# Patient Record
Sex: Male | Born: 1955 | Race: White | Hispanic: No | Smoking: Never smoker
Health system: Southern US, Community
[De-identification: ages and names within clinical notes are randomized; demographics above are authoritative.]

## PROBLEM LIST (undated history)

## (undated) DIAGNOSIS — K219 Gastro-esophageal reflux disease without esophagitis: Secondary | ICD-10-CM

## (undated) DIAGNOSIS — F32A Depression, unspecified: Secondary | ICD-10-CM

## (undated) DIAGNOSIS — G4730 Sleep apnea, unspecified: Secondary | ICD-10-CM

## (undated) DIAGNOSIS — G2581 Restless legs syndrome: Secondary | ICD-10-CM

## (undated) DIAGNOSIS — T7840XA Allergy, unspecified, initial encounter: Secondary | ICD-10-CM

## (undated) DIAGNOSIS — C801 Malignant (primary) neoplasm, unspecified: Secondary | ICD-10-CM

## (undated) DIAGNOSIS — F419 Anxiety disorder, unspecified: Secondary | ICD-10-CM

## (undated) DIAGNOSIS — K2270 Barrett's esophagus without dysplasia: Secondary | ICD-10-CM

## (undated) DIAGNOSIS — D649 Anemia, unspecified: Secondary | ICD-10-CM

## (undated) DIAGNOSIS — M1990 Unspecified osteoarthritis, unspecified site: Secondary | ICD-10-CM

## (undated) HISTORY — DX: Anxiety disorder, unspecified: F41.9

## (undated) HISTORY — PX: MOUTH SURGERY: SHX715

## (undated) HISTORY — DX: Gastro-esophageal reflux disease without esophagitis: K21.9

## (undated) HISTORY — DX: Malignant (primary) neoplasm, unspecified: C80.1

## (undated) HISTORY — PX: ROTATOR CUFF REPAIR: SHX139

## (undated) HISTORY — DX: Depression, unspecified: F32.A

## (undated) HISTORY — DX: Barrett's esophagus without dysplasia: K22.70

## (undated) HISTORY — DX: Sleep apnea, unspecified: G47.30

## (undated) HISTORY — PX: CHOLECYSTECTOMY: SHX55

## (undated) HISTORY — DX: Anemia, unspecified: D64.9

## (undated) HISTORY — DX: Unspecified osteoarthritis, unspecified site: M19.90

## (undated) HISTORY — DX: Restless legs syndrome: G25.81

## (undated) HISTORY — PX: SKIN CANCER EXCISION: SHX779

## (undated) HISTORY — DX: Allergy, unspecified, initial encounter: T78.40XA

---

## 2005-06-15 ENCOUNTER — Ambulatory Visit: Payer: Self-pay | Admitting: Internal Medicine

## 2005-07-13 ENCOUNTER — Ambulatory Visit: Payer: Self-pay | Admitting: Internal Medicine

## 2005-10-30 ENCOUNTER — Ambulatory Visit: Payer: Self-pay | Admitting: Internal Medicine

## 2005-11-05 ENCOUNTER — Ambulatory Visit: Payer: Self-pay | Admitting: Internal Medicine

## 2005-11-27 ENCOUNTER — Ambulatory Visit: Payer: Self-pay | Admitting: Gastroenterology

## 2005-12-28 ENCOUNTER — Encounter (INDEPENDENT_AMBULATORY_CARE_PROVIDER_SITE_OTHER): Payer: Self-pay | Admitting: *Deleted

## 2005-12-28 ENCOUNTER — Ambulatory Visit: Payer: Self-pay | Admitting: Gastroenterology

## 2006-01-09 ENCOUNTER — Ambulatory Visit: Payer: Self-pay | Admitting: Internal Medicine

## 2006-02-01 ENCOUNTER — Ambulatory Visit: Payer: Self-pay | Admitting: Gastroenterology

## 2006-05-20 ENCOUNTER — Ambulatory Visit: Payer: Self-pay | Admitting: Internal Medicine

## 2006-06-03 ENCOUNTER — Ambulatory Visit (HOSPITAL_COMMUNITY): Admission: RE | Admit: 2006-06-03 | Discharge: 2006-06-03 | Payer: Self-pay | Admitting: General Surgery

## 2006-06-03 ENCOUNTER — Encounter (INDEPENDENT_AMBULATORY_CARE_PROVIDER_SITE_OTHER): Payer: Self-pay | Admitting: Specialist

## 2007-01-13 ENCOUNTER — Ambulatory Visit: Payer: Self-pay | Admitting: Internal Medicine

## 2007-01-13 LAB — CONVERTED CEMR LAB
ALT: 15 units/L (ref 0–40)
AST: 15 units/L (ref 0–37)
Albumin: 4 g/dL (ref 3.5–5.2)
Alkaline Phosphatase: 66 units/L (ref 39–117)
BUN: 8 mg/dL (ref 6–23)
Basophils Absolute: 0 10*3/uL (ref 0.0–0.1)
Basophils Relative: 0.7 % (ref 0.0–1.0)
Bilirubin Urine: NEGATIVE
Bilirubin, Direct: 0.1 mg/dL (ref 0.0–0.3)
CO2: 31 meq/L (ref 19–32)
Calcium: 9 mg/dL (ref 8.4–10.5)
Chloride: 111 meq/L (ref 96–112)
Cholesterol: 179 mg/dL (ref 0–200)
Creatinine, Ser: 1 mg/dL (ref 0.4–1.5)
Eosinophils Absolute: 0.2 10*3/uL (ref 0.0–0.6)
Eosinophils Relative: 3.4 % (ref 0.0–5.0)
GFR calc Af Amer: 102 mL/min
GFR calc non Af Amer: 84 mL/min
Glucose, Bld: 101 mg/dL — ABNORMAL HIGH (ref 70–99)
HCT: 46.4 % (ref 39.0–52.0)
HDL: 49.6 mg/dL (ref >39.0)
Hemoglobin, Urine: NEGATIVE
Hemoglobin: 16.3 g/dL (ref 13.0–17.0)
Ketones, ur: NEGATIVE mg/dL
LDL Cholesterol: 108 mg/dL — ABNORMAL HIGH (ref 0–99)
Leukocytes, UA: NEGATIVE
Lymphocytes Relative: 25.6 % (ref 12.0–46.0)
MCHC: 35.2 g/dL (ref 30.0–36.0)
MCV: 90 fL (ref 78.0–100.0)
Monocytes Absolute: 0.5 10*3/uL (ref 0.2–0.7)
Monocytes Relative: 9.6 % (ref 3.0–11.0)
Neutro Abs: 2.9 10*3/uL (ref 1.4–7.7)
Neutrophils Relative %: 60.7 % (ref 43.0–77.0)
Nitrite: NEGATIVE
Platelets: 214 10*3/uL (ref 150–400)
Potassium: 4.4 meq/L (ref 3.5–5.1)
RBC: 5.16 M/uL (ref 4.22–5.81)
RDW: 12.1 % (ref 11.5–14.6)
Sodium: 145 meq/L (ref 135–145)
Specific Gravity, Urine: 1.02 (ref 1.000–1.03)
TSH: 1.26 microintl units/mL (ref 0.35–5.50)
Total Bilirubin: 0.8 mg/dL (ref 0.3–1.2)
Total CHOL/HDL Ratio: 3.6
Total Protein, Urine: NEGATIVE mg/dL
Total Protein: 6.8 g/dL (ref 6.0–8.3)
Triglycerides: 106 mg/dL (ref 0–149)
Urine Glucose: NEGATIVE mg/dL
Urobilinogen, UA: 0.2 (ref 0.0–1.0)
VLDL: 21 mg/dL (ref 0–40)
WBC: 4.8 10*3/uL (ref 4.5–10.5)
pH: 6 (ref 5.0–8.0)

## 2007-01-21 ENCOUNTER — Ambulatory Visit: Payer: Self-pay | Admitting: Internal Medicine

## 2007-03-17 ENCOUNTER — Telehealth: Payer: Self-pay | Admitting: Internal Medicine

## 2007-03-18 ENCOUNTER — Telehealth (INDEPENDENT_AMBULATORY_CARE_PROVIDER_SITE_OTHER): Payer: Self-pay | Admitting: *Deleted

## 2007-03-28 ENCOUNTER — Telehealth: Payer: Self-pay | Admitting: Internal Medicine

## 2007-04-02 DIAGNOSIS — K219 Gastro-esophageal reflux disease without esophagitis: Secondary | ICD-10-CM | POA: Insufficient documentation

## 2007-05-05 ENCOUNTER — Telehealth: Payer: Self-pay | Admitting: Internal Medicine

## 2007-05-06 ENCOUNTER — Telehealth (INDEPENDENT_AMBULATORY_CARE_PROVIDER_SITE_OTHER): Payer: Self-pay | Admitting: *Deleted

## 2007-07-11 ENCOUNTER — Encounter: Payer: Self-pay | Admitting: Internal Medicine

## 2008-06-01 IMAGING — RF DG CHOLANGIOGRAM OPERATIVE
1 series · 4 of 4 positions shown · non-contrast
Comparison: none

CLINICAL DATA: Cholelithiasis

[Series 1: run · 4 of 26 frames shown]
[frame 1/26]
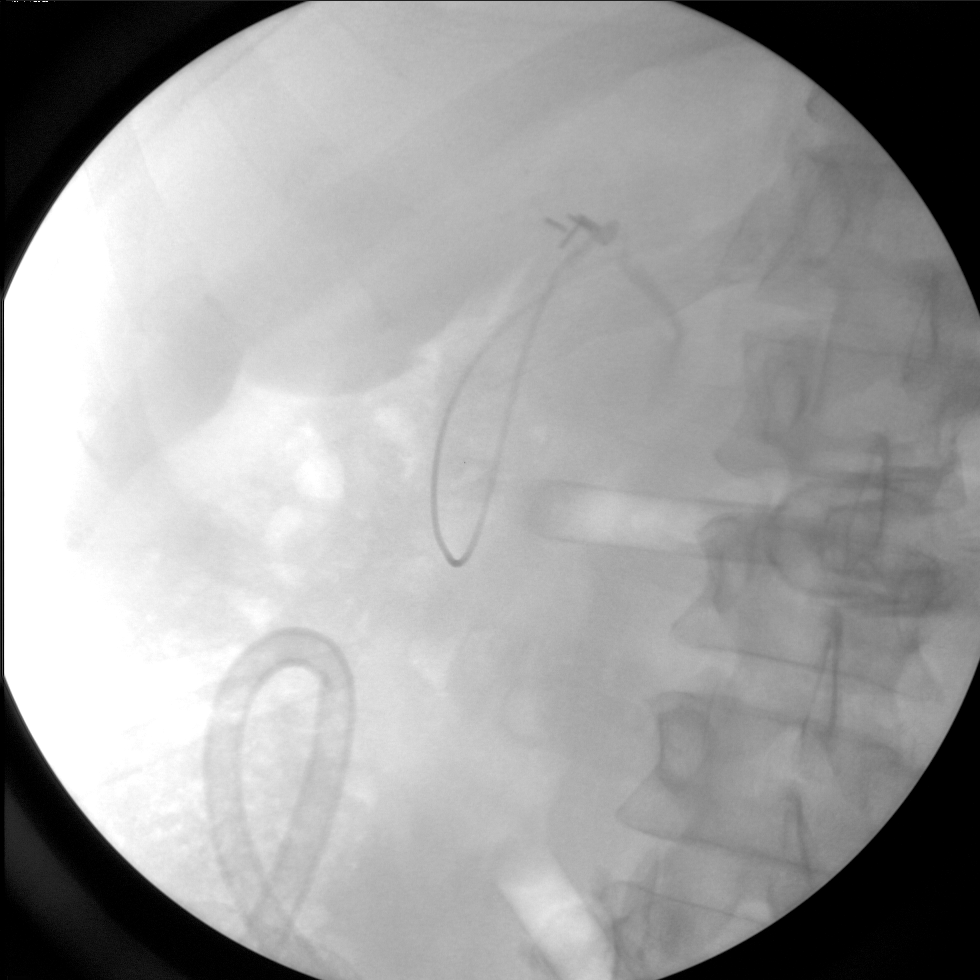
[frame 4/26]
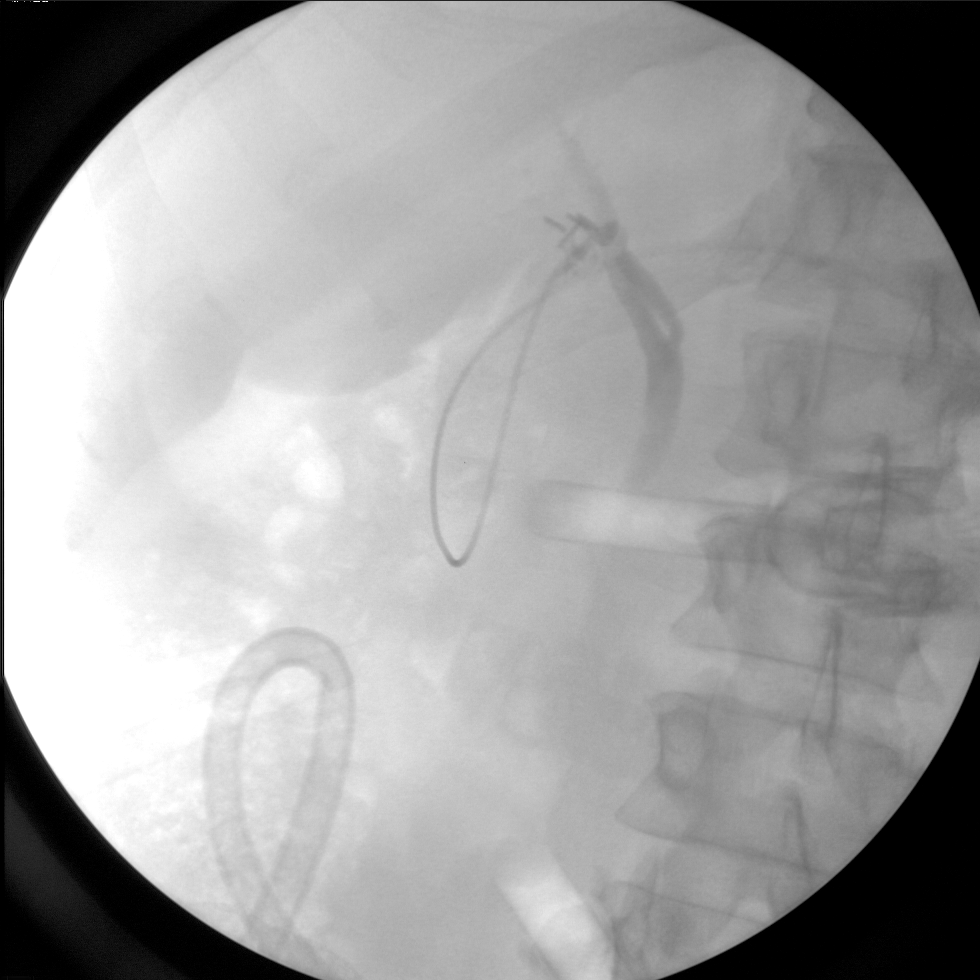
[frame 14/26]
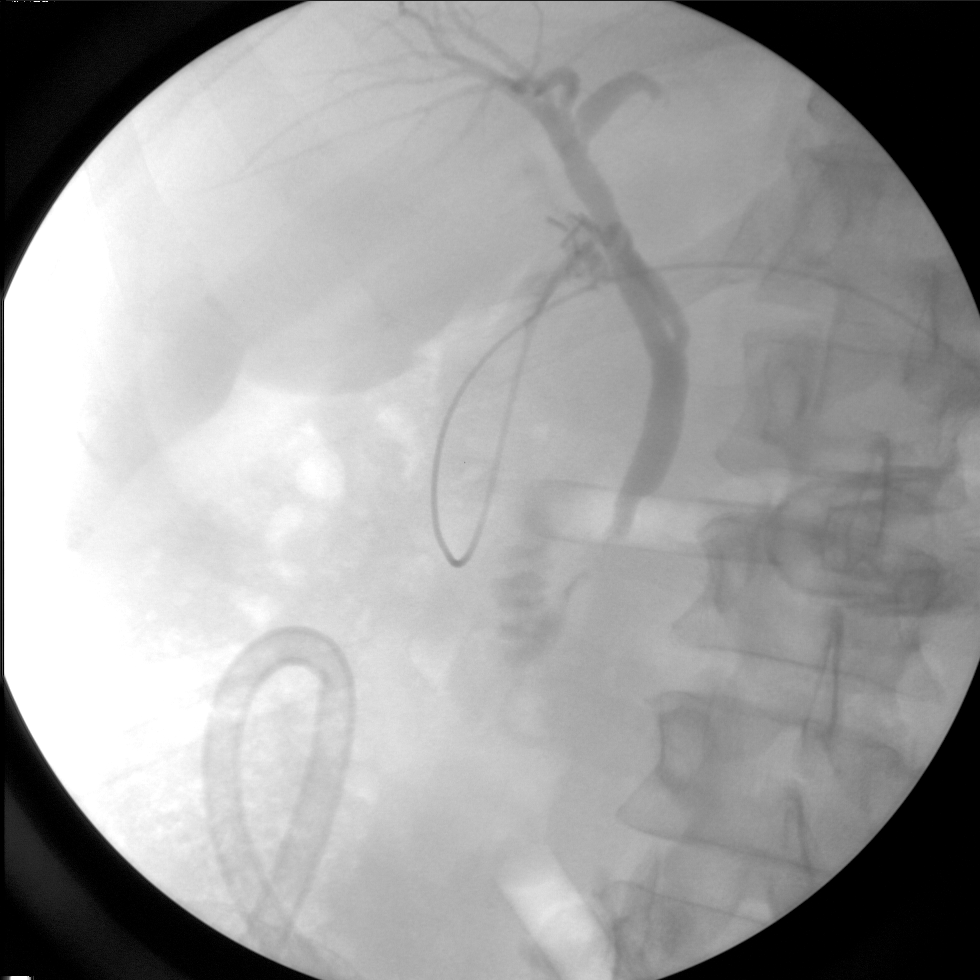
[frame 23/26]
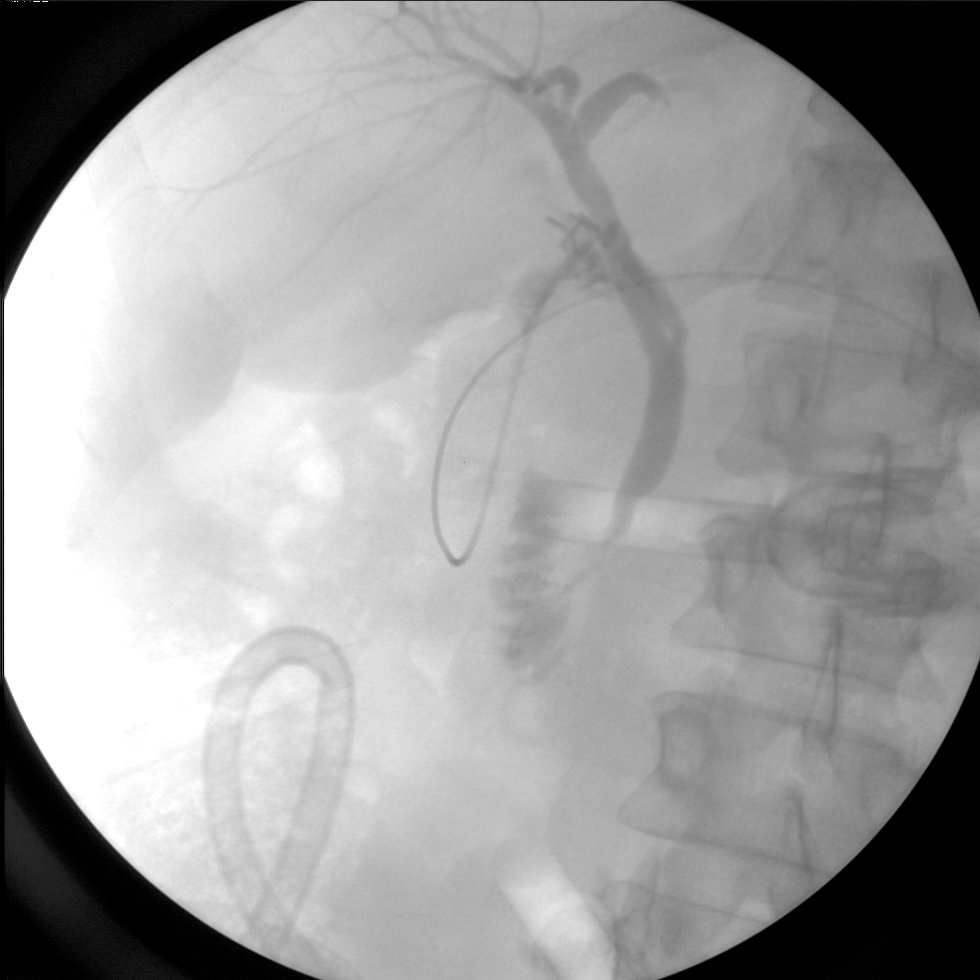

[4 of 4 positions shown; findings below may reference images not displayed]

INTRAOPERATIVE CHOLANGIOGRAM:

26  images from intraoperative C-arm fluoroscopy demonstrate  opacification of
the common bile duct. No filling defects to suggest retained stones. There is
incomplete evaluation of intrahepatic biliary tree, which appears decompressed
centrally. Contrast appears to flow on into decompressed duodenum.
IMPRESSION: 1. Negative for retained common duct stone

## 2009-02-21 ENCOUNTER — Ambulatory Visit: Payer: Self-pay | Admitting: Internal Medicine

## 2010-12-15 NOTE — Op Note (Signed)
NAME:  AASHIR, UMHOLTZ NO.:  [id number]   MEDICAL RECORD NO.:  [id number]          PATIENT TYPE:  AMB   LOCATION:  SDS                          FACILITY:  [facility]   PHYSICIAN:  Cherylynn Ridges, M.D.    DATE OF BIRTH:  Feb 24, 1956   DATE OF PROCEDURE:  06/03/2006  DATE OF DISCHARGE:                                 OPERATIVE REPORT   PREOPERATIVE DIAGNOSIS:  Symptomatic cholelithiasis.   POSTOPERATIVE DIAGNOSIS:  Symptomatic cholelithiasis.   PROCEDURE:  Laparoscopic cholecystectomy with cholangiogram.   SURGEON:  Cherylynn Ridges, M.D.   ASSISTANT:  Adolph Pollack, M.D.   ANESTHESIA:  General endotracheal.   ESTIMATED BLOOD LOSS:  Less than 50 mL.   COMPLICATIONS:  None.   CONDITION:  Stable.   FINDINGS:  Mildly acutely inflamed gallbladder with distension and normal  cholangiogram with good flow into the duodenum, no ductal obstruction.  No  ductal defects and good proximal filling.   INDICATIONS FOR OPERATION:  The patient is a 55 year old recently diagnosed  with gallstones who comes in now for laparoscopic cholecystectomy.   OPERATION:  The patient was taken to the operating room, placed on table in  supine position.  After an adequate endotracheal anesthetic was administered  he was prepped and draped in usual sterile manner exposing midline and right  upper quadrant.   A supraumbilical curvilinear incision was made using #11 blade taken down to  the midline fascia.  We subsequently used Kocher clamps to tent up the  fascia and then make an incision between the Kocher clamps with a 15 blade.  This exposed [place] to the preperitoneal space which upon tenting up the abdomen  with Kocher clamps we were able to dissect down bluntly into the peritoneal  cavity using a Kelly clamp.  Once this was done a pursestring suture of 0  Vicryl was passed around the fascial opening and the Hassan cannula passed  into the peritoneal cavity and secured in place with a  pursestring suture.   Carbon dioxide insufflation was instilled into the peritoneal cavity through  the [facility] cannula up to maximal intra-abdominal pressure of 15 mmHg.  Two  right costal margin 5-mm cannulas and a subxiphoid 11/12 mm cannula passed  under direct vision into the peritoneal cavity.  Once this was done, the  patient was placed in steep reverse Trendelenburg, left side was tilted down  and the dissection begun.   There were some adhesions to the body and dome of the gallbladder that we  were able to take down bluntly and retract the gallbladder towards the  anterior abdominal wall and right upper quadrant.  We then passed a second  grasper onto the infundibulum retracted it laterally as we exposed  peritoneum overlying the triangle of Calot and hepatoduodenal triangle.   Once this peritoneum was exposed, we dissected out the cystic duct and  cystic artery.  Once the cystic duct was adequately dissected away as there  was a blood vessel running along with the cystic duct, we were able to make  a cholecystodochotomy through which a Cook catheter  which had been passed  through the anterior abdominal wall was passed in order performed  cholangiogram.  The cholangiogram showed good flow into the duodenum, no  ductal obstruction, no filling defects and good proximal filling.   Once cholangiogram was completed, we insufflated the abdomen again, removed  the clips securing the catheter in place and then clipped the cystic duct  distally x3.   We transected the cystic duct which on cholangiogram showed to be long and  extended across the common duct into the medial aspect of the common duct.  We then subsequently dissected out the cystic artery which was clipped  proximally and distally and transected.  We then dissected out the  gallbladder from its bed with minimal difficulty obtaining hemostasis in the  gallbladder bed using electrocautery.   Once cholangiogram was  completed and the gallbladder completely removed from  its bed, we cauterized for hemostasis found to be good hemostasis, we  removed the gallbladder from the supraumbilical site with minimal  difficulty.  We closed out the supraumbilical fascial site using the  pursestring suture which was in place from the [place] cannula and we  irrigated all sites and the intra-abdominal with saline solution.  All  counts were correct.  We injected 0.25% Marcaine with epi at all sites and  we closed the supraumbilical skin and the subxiphoid skin using running  subcuticular stitch of 4-0 Vicryl.  Dermabond was used to close the 5-mm  cannula site.  Steri-Strips were placed on all wounds, antibiotic ointment  and then a sterile dressing.  All needle, sponge counts and instrument  counts were correct.      Cherylynn Ridges, M.D.  Electronically Signed     JOW/MEDQ  D:  06/03/2006  T:  06/04/2006  Job:  [id number]

## 2017-10-02 LAB — HM COLONOSCOPY

## 2021-11-27 ENCOUNTER — Encounter: Payer: Self-pay | Admitting: Nurse Practitioner

## 2021-11-27 ENCOUNTER — Ambulatory Visit: Payer: Medicare Other | Attending: Nurse Practitioner | Admitting: Nurse Practitioner

## 2021-11-27 VITALS — BP 120/80 | Ht 69.0 in | Wt 193.3 lb

## 2021-11-27 DIAGNOSIS — G8929 Other chronic pain: Secondary | ICD-10-CM | POA: Diagnosis not present

## 2021-11-27 DIAGNOSIS — Z7689 Persons encountering health services in other specified circumstances: Secondary | ICD-10-CM

## 2021-11-27 DIAGNOSIS — M5441 Lumbago with sciatica, right side: Secondary | ICD-10-CM

## 2021-11-27 DIAGNOSIS — M5442 Lumbago with sciatica, left side: Secondary | ICD-10-CM

## 2021-11-27 DIAGNOSIS — K2270 Barrett's esophagus without dysplasia: Secondary | ICD-10-CM
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
## 2021-11-28 ENCOUNTER — Encounter: Payer: Self-pay | Admitting: Nurse Practitioner

## 2021-12-04 ENCOUNTER — Ambulatory Visit: Payer: Medicare Other | Admitting: Physician Assistant

## 2021-12-06 ENCOUNTER — Encounter: Payer: Self-pay | Admitting: Orthopaedic Surgery

## 2021-12-06 ENCOUNTER — Ambulatory Visit (INDEPENDENT_AMBULATORY_CARE_PROVIDER_SITE_OTHER): Payer: Medicare Other

## 2021-12-06 ENCOUNTER — Ambulatory Visit: Payer: Medicare Other | Admitting: Orthopaedic Surgery

## 2021-12-06 DIAGNOSIS — M545 Low back pain, unspecified: Secondary | ICD-10-CM

## 2021-12-06 DIAGNOSIS — G8929 Other chronic pain: Secondary | ICD-10-CM | POA: Diagnosis not present

## 2021-12-06 DIAGNOSIS — M5126 Other intervertebral disc displacement, lumbar region: Secondary | ICD-10-CM
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
## 2021-12-12 ENCOUNTER — Encounter: Payer: Self-pay | Admitting: Nurse Practitioner

## 2021-12-15 ENCOUNTER — Other Ambulatory Visit: Payer: Self-pay | Admitting: Nurse Practitioner

## 2021-12-15 DIAGNOSIS — G4733 Obstructive sleep apnea (adult) (pediatric): Secondary | ICD-10-CM

## 2021-12-15 MED ORDER — TRAMADOL HCL 50 MG PO TABS: 50.0000 mg | ORAL_TABLET | Freq: Three times a day (TID) | ORAL | 2 refills | Status: AC

## 2021-12-15 MED ORDER — MISC. DEVICES MISC: 0 refills | Status: AC

## 2021-12-16 ENCOUNTER — Encounter: Payer: Self-pay | Admitting: Nurse Practitioner

## 2021-12-20 ENCOUNTER — Telehealth: Payer: Self-pay | Admitting: Nurse Practitioner

## 2021-12-20 NOTE — Telephone Encounter (Signed)
Paperwork has been received and will be completed by Pharmacy.

## 2021-12-20 NOTE — Telephone Encounter (Signed)
Copied from [place] [id number]. Topic: General - Other >> Dec 20, 2021  9:01 AM Yvette Rack wrote: Reason for CRM: Hassan Rowan with Medicare called to discuss PA for traMADol (ULTRAM) 50 MG tablet. Cb# [phone] Option# 5

## 2021-12-27 DIAGNOSIS — F329 Major depressive disorder, single episode, unspecified: Secondary | ICD-10-CM | POA: Diagnosis not present

## 2022-01-16 ENCOUNTER — Encounter: Payer: Self-pay | Admitting: Orthopaedic Surgery

## 2022-01-16 ENCOUNTER — Ambulatory Visit: Payer: Medicare Other | Admitting: Orthopaedic Surgery

## 2022-01-16 VITALS — BP 125/82 | HR 65 | Ht 69.0 in | Wt 193.0 lb

## 2022-01-16 DIAGNOSIS — M5126 Other intervertebral disc displacement, lumbar region: Secondary | ICD-10-CM | POA: Diagnosis not present
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
## 2022-01-18 ENCOUNTER — Encounter: Payer: Self-pay | Admitting: Nurse Practitioner

## 2022-02-27 ENCOUNTER — Ambulatory Visit: Payer: Self-pay | Admitting: Internal Medicine

## 2022-03-14 ENCOUNTER — Encounter: Payer: Self-pay | Admitting: Nurse Practitioner

## 2022-03-14 ENCOUNTER — Other Ambulatory Visit: Payer: Self-pay | Admitting: Nurse Practitioner

## 2022-03-23 ENCOUNTER — Ambulatory Visit: Payer: Medicare Other | Admitting: Internal Medicine

## 2022-03-24 ENCOUNTER — Other Ambulatory Visit: Payer: Self-pay | Admitting: Nurse Practitioner

## 2022-03-24 DIAGNOSIS — F339 Major depressive disorder, recurrent, unspecified: Secondary | ICD-10-CM

## 2022-03-24 DIAGNOSIS — M5126 Other intervertebral disc displacement, lumbar region: Secondary | ICD-10-CM

## 2022-03-24 MED ORDER — VENLAFAXINE HCL 37.5 MG PO TABS: 37.5000 mg | ORAL_TABLET | Freq: Two times a day (BID) | ORAL | 1 refills | Status: DC

## 2022-03-24 MED ORDER — TRAMADOL HCL 50 MG PO TABS: 50.0000 mg | ORAL_TABLET | Freq: Three times a day (TID) | ORAL | 2 refills | Status: DC | PRN

## 2022-04-10 ENCOUNTER — Encounter: Payer: Self-pay | Admitting: Nurse Practitioner

## 2022-04-13 ENCOUNTER — Other Ambulatory Visit: Payer: Self-pay | Admitting: Nurse Practitioner

## 2022-04-13 DIAGNOSIS — K219 Gastro-esophageal reflux disease without esophagitis: Secondary | ICD-10-CM

## 2022-04-13 MED ORDER — OMEPRAZOLE 20 MG PO CPDR: 20.0000 mg | DELAYED_RELEASE_CAPSULE | Freq: Two times a day (BID) | ORAL | 1 refills | Status: DC

## 2022-05-10 ENCOUNTER — Ambulatory Visit: Payer: Medicare Other | Admitting: Physician Assistant

## 2022-06-04 ENCOUNTER — Ambulatory Visit: Payer: Medicare Other | Admitting: Nurse Practitioner

## 2022-06-11 ENCOUNTER — Encounter: Payer: Self-pay | Admitting: Gastroenterology

## 2022-06-25 ENCOUNTER — Other Ambulatory Visit: Payer: Self-pay | Admitting: Nurse Practitioner

## 2022-06-25 DIAGNOSIS — F339 Major depressive disorder, recurrent, unspecified: Secondary | ICD-10-CM

## 2022-06-25 DIAGNOSIS — M5126 Other intervertebral disc displacement, lumbar region: Secondary | ICD-10-CM

## 2022-06-26 ENCOUNTER — Encounter: Payer: Self-pay | Admitting: Nurse Practitioner

## 2022-06-26 ENCOUNTER — Other Ambulatory Visit: Payer: Self-pay | Admitting: Nurse Practitioner

## 2022-06-26 DIAGNOSIS — M5126 Other intervertebral disc displacement, lumbar region: Secondary | ICD-10-CM

## 2022-06-26 MED ORDER — TRAMADOL HCL 50 MG PO TABS: 50.0000 mg | ORAL_TABLET | Freq: Three times a day (TID) | ORAL | 2 refills | Status: DC | PRN

## 2022-06-26 NOTE — Telephone Encounter (Signed)
Unable to refill per protocol, Rx request is too soon. Last RF 03/24/22 for 90 and 1 RF. Will refuse.  Requested Prescriptions  Pending Prescriptions Disp Refills   venlafaxine (EFFEXOR) 37.5 MG tablet [Pharmacy Med Name: VENLAFAXINE HCL 37.5 MG TABLET] 180 tablet 1    Sig: TAKE 1 TABLET BY MOUTH 2 TIMES DAILY.     Psychiatry: Antidepressants - SNRI - desvenlafaxine & venlafaxine Failed - 06/25/2022  6:08 PM      Failed - Cr in normal range and within 360 days    Creatinine, Ser  Date Value Ref Range Status  01/13/2007 1.0 0.4 - 1.5 mg/dL Final         Failed - Valid encounter within last 6 months    Recent Outpatient Visits           7 months ago Encounter to establish care   [name], [place] W, NP              Failed - Lipid Panel in normal range within the last 12 months    Cholesterol  Date Value Ref Range Status  01/13/2007 179 0 - 200 mg/dL Final    Comment:    See lab report for associated comment(s)   LDL Cholesterol  Date Value Ref Range Status  01/13/2007 108 (H) 0 - 99 mg/dL Final   HDL  Date Value Ref Range Status  01/13/2007 49.6 >39.0 mg/dL Final   Triglycerides  Date Value Ref Range Status  01/13/2007 106 0 - 149 mg/dL Final    Comment:    See lab report for associated comment(s)         Passed - Last BP in normal range    BP Readings from Last 1 Encounters:  01/16/22 125/82          traMADol (ULTRAM) 50 MG tablet [Pharmacy Med Name: TRAMADOL HCL 50 MG TABLET] 90 tablet 2    Sig: TAKE 1 TABLET BY MOUTH EVERY 8 HOURS AS NEEDED.     Not Delegated - Analgesics:  Opioid Agonists Failed - 06/25/2022  6:08 PM      Failed - This refill cannot be delegated      Failed - Urine Drug Screen completed in last 360 days      Failed - Valid encounter within last 3 months    Recent Outpatient Visits           7 months ago Encounter to establish care   [facility], [name],  NP

## 2022-07-04 ENCOUNTER — Telehealth: Payer: Self-pay | Admitting: *Deleted

## 2022-07-04 NOTE — Telephone Encounter (Signed)
Pt. Scheduled as a direct EGD,please review chart and advise if ok to proceed as direct or if need OV ?

## 2022-07-04 NOTE — Telephone Encounter (Signed)
Call pt.to schedule OV Message left with call back #.

## 2022-07-09 NOTE — Telephone Encounter (Signed)
Message left with call back #.

## 2022-07-11 ENCOUNTER — Telehealth: Payer: Self-pay

## 2022-07-11 NOTE — Telephone Encounter (Signed)
PV on 12/14 in [place] to be moved to earlier appt. If patient is available

## 2022-07-12 ENCOUNTER — Ambulatory Visit (AMBULATORY_SURGERY_CENTER): Payer: Self-pay

## 2022-07-12 ENCOUNTER — Other Ambulatory Visit: Payer: Self-pay

## 2022-07-12 VITALS — Ht 69.0 in | Wt 200.0 lb

## 2022-07-12 DIAGNOSIS — K2270 Barrett's esophagus without dysplasia: Secondary | ICD-10-CM

## 2022-07-12 NOTE — Progress Notes (Signed)
Denies allergies to eggs or soy products. Denies complication of anesthesia or sedation. Denies use of weight loss medication. Denies use of O2.   Emmi instructions given for colonoscopy.  

## 2022-07-13 NOTE — Telephone Encounter (Signed)
Attempted to contact pt,message left with call back #.

## 2022-07-16 NOTE — Telephone Encounter (Signed)
Attempted to contact pt. At alternative # [phone] message left with call back #.

## 2022-07-16 NOTE — Telephone Encounter (Signed)
Attempted to call pt. Message left that we need to schedule OV and call back # left.

## 2022-07-25 ENCOUNTER — Encounter: Payer: Self-pay | Admitting: Gastroenterology

## 2022-07-25 NOTE — Telephone Encounter (Signed)
Called pt and spoke with him about needing his prior GI hx and Dr. Candis Schatz would like an OV prior to [place]. Pt states he has all his prior EGD reports and was told at the previsit that he did'nt need to bring those in. He is willing to bring records in for Dr. Candis Schatz and proceed with EGD as scheduled or come for an OV first, whatever Dr. Candis Schatz prefers. Please advise.

## 2022-07-26 NOTE — Telephone Encounter (Signed)
Spoke with pt and he is aware and will bring his records in to be scanned into the EMR. EGD cancelled. OV scheduled for pt 09/28/22 '@3'$ :40pm as pt will be traveling for work in Jan and Feb. Pt aware of appt.

## 2022-08-01 ENCOUNTER — Encounter: Payer: Medicare Other | Admitting: Gastroenterology

## 2022-08-08 DIAGNOSIS — F329 Major depressive disorder, single episode, unspecified: Secondary | ICD-10-CM | POA: Diagnosis not present

## 2022-08-13 ENCOUNTER — Other Ambulatory Visit: Payer: Self-pay | Admitting: Nurse Practitioner

## 2022-08-13 ENCOUNTER — Encounter: Payer: Self-pay | Admitting: Nurse Practitioner

## 2022-08-13 MED ORDER — SILDENAFIL CITRATE 50 MG PO TABS: 50.0000 mg | ORAL_TABLET | Freq: Every day | ORAL | 1 refills | Status: DC | PRN

## 2022-08-28 ENCOUNTER — Ambulatory Visit: Payer: Medicare Other | Admitting: Gastroenterology

## 2022-09-27 ENCOUNTER — Encounter: Payer: Self-pay | Admitting: Nurse Practitioner

## 2022-09-28 ENCOUNTER — Other Ambulatory Visit: Payer: Self-pay | Admitting: Nurse Practitioner

## 2022-09-28 ENCOUNTER — Ambulatory Visit (INDEPENDENT_AMBULATORY_CARE_PROVIDER_SITE_OTHER): Payer: Medicare Other | Admitting: Gastroenterology

## 2022-09-28 ENCOUNTER — Encounter: Payer: Self-pay | Admitting: Gastroenterology

## 2022-09-28 VITALS — BP 120/80 | HR 70 | Ht 69.0 in | Wt 197.0 lb

## 2022-09-28 DIAGNOSIS — F339 Major depressive disorder, recurrent, unspecified: Secondary | ICD-10-CM

## 2022-09-28 DIAGNOSIS — K2270 Barrett's esophagus without dysplasia: Secondary | ICD-10-CM

## 2022-09-28 DIAGNOSIS — K219 Gastro-esophageal reflux disease without esophagitis: Secondary | ICD-10-CM

## 2022-09-28 NOTE — Patient Instructions (Addendum)
_______________________________________________________  If your blood pressure at your visit was 140/90 or greater, please contact your primary care physician to follow up on this.  _______________________________________________________  If you are age 66 or older, your body mass index should be between 23-30. Your Body mass index is 29.09 kg/m. If this is out of the aforementioned range listed, please consider follow up with your Primary Care Provider.  If you are age 84 or younger, your body mass index should be between 19-25. Your Body mass index is 29.09 kg/m. If this is out of the aformentioned range listed, please consider follow up with your Primary Care Provider.   Please send a mychart message after  you follow up with primary care.  Decrease Omeprazole to once daily. ________________________________________________________  The [facility] providers would like to encourage you to use [facility] to communicate with providers for non-urgent requests or questions.  Due to long hold times on the telephone, sending your provider a message by [facility] may be a faster and more efficient way to get a response.  Please allow 48 business hours for a response.  Please remember that this is for non-urgent requests.   It was a pleasure to see you today!  Thank you for trusting me with your gastrointestinal care!    Scott E.Candis Schatz, MD

## 2022-09-28 NOTE — Progress Notes (Addendum)
HPI : Kurt Martinez is a very pleasant 67 year old male with a history of Barrett's esophagus, anxiety, depression and OSA who is referred to by Geryl Rankins, NP to establish care for ongoing Barrett's surveillance.  The patient states that he was diagnosed with Barrett's in 2007 and has undergone close surveillance ever since then.  He was previously followed by a provider in [place]. He states that he has never had dysplasia and has never undergone ablation or any other treatment's for Barrett's. He has been taking omeprazole twice daily since 2007.  His GERD symptoms have been very well controlled on this regimen.  He has never tried decreasing to once a day.  No dysphagia.  Weight is stable. He has no family history of esophageal cancer (father died from unknown cancer at age 35) He quit smoking many years ago  His last EGD was in 2019 and 4 cm of circumferential [id number]) Barrett's mucosa was seen.  Biopsies showed intestinal metaplasia without dysplasia.  The patient is very active and exercises regularly.  He has no known cardiopulmonary comorbidities or any chronic concerning symptoms.  However, last week, he experienced very profound symptoms of fatigue, light-headedness/dizziness with shortness of breath.  This lasted an entire day.  He denied chest pain/pressure. When he woke up the next day, he felt fine.  He did not take his pulse or his blood pressure during this time.  He has never had any symptoms like that before or since.   He has a reported history of propofol allergy.  This stemmed from his EGD around 2016 (the one before 2019). He apparently awoke from his procedure and had a diffuse red rash with swelling.  No respiratory symptoms, no itching.  He did not require any benadryl or steroids and the rash resolved, but it was felt to be most likely secondary to the propofol.  His EGD in 2019 was performed using versed and demerol.  EGD 2019:  Long-segment (4cm) nondysplastic  Barrett's Colonoscopy 2019:  Normal  Past Medical History:  Diagnosis Date   Allergy    Anemia    Anxiety    Arthritis    Barretts esophagus    Cancer ([place])    Skin cancer   Depression    GERD (gastroesophageal reflux disease)    Restless leg syndrome    Sleep apnea      Past Surgical History:  Procedure Laterality Date   CHOLECYSTECTOMY     2007   MOUTH SURGERY     tooth removed   ROTATOR CUFF REPAIR Right    SKIN CANCER EXCISION     Basel cell 2008, 2003   Family History  Problem Relation Age of Onset   Brain cancer Mother    Cancer Father    Prostate cancer Brother    Colon cancer Neg Hx    Esophageal cancer Neg Hx    Rectal cancer Neg Hx    Stomach cancer Neg Hx    Social History   Tobacco Use   Smoking status: Never   Smokeless tobacco: Never  Vaping Use   Vaping Use: Never used  Substance Use Topics   Alcohol use: Yes    Comment: once a month   Drug use: Never   Current Outpatient Medications  Medication Sig Dispense Refill   Misc. Devices MISC Please provide patient with insurance approved cpap equipment (mask and hose). Faxed to [facility] in [place], [place]. ICD 10 G47.33 Z99.89 1 each 0  omeprazole (PRILOSEC) 20 MG capsule Take 1 capsule (20 mg total) by mouth 2 (two) times daily before a meal. 180 capsule 1   sildenafil (VIAGRA) 50 MG tablet Take 1-2 tablets (50-100 mg total) by mouth daily as needed for erectile dysfunction. 10 tablet 1   traMADol (ULTRAM) 50 MG tablet Take 1 tablet (50 mg total) by mouth every 8 (eight) hours as needed. 90 tablet 2   venlafaxine (EFFEXOR) 37.5 MG tablet Take 1 tablet (37.5 mg total) by mouth 2 (two) times daily. 180 tablet 1   No current facility-administered medications for this visit.   Allergies  Allergen Reactions   Propofol Other (See Comments), Rash and Shortness Of Breath    Patient states he had an endoscopy procedure recently, was told by MD they had to oxygenate him  for a while, had difficulty waking him and he developed rash\whelps      Review of Systems: All systems reviewed and negative except where noted in HPI.    No results found.  Physical Exam: BP 120/80   Pulse 70   Ht '5\' 9"'$  (1.753 m)   Wt 197 lb (89.4 kg)   SpO2 98%   BMI 29.09 kg/m  Constitutional: Pleasant,well-developed, Caucasian male in no acute distress. HEENT: Normocephalic and atraumatic. Conjunctivae are normal. No scleral icterus. Cardiovascular: Normal rate, regular rhythm.  Pulmonary/chest: Effort normal and breath sounds normal. No wheezing, rales or rhonchi. Abdominal: Soft, nondistended, nontender. Bowel sounds active throughout. There are no masses palpable. No hepatomegaly. Extremities: no edema Neurological: Alert and oriented to person place and time. Skin: Skin is warm and dry. No rashes noted. Psychiatric: Normal mood and affect. Behavior is normal.  CBC    Component Value Date/Time   WBC 4.8 01/13/2007 0834   RBC 5.16 01/13/2007 0834   HGB 16.3 01/13/2007 0834   HCT 46.4 01/13/2007 0834   PLT 214 01/13/2007 0834   MCV 90.0 01/13/2007 0834   MCHC 35.2 01/13/2007 0834   RDW 12.1 01/13/2007 0834   MONOABS 0.5 01/13/2007 0834   EOSABS 0.2 01/13/2007 0834   BASOSABS 0.0 01/13/2007 0834    CMP     Component Value Date/Time   NA 145 01/13/2007 0834   K 4.4 01/13/2007 0834   CL 111 01/13/2007 0834   CO2 31 01/13/2007 0834   GLUCOSE 101 (H) 01/13/2007 0834   BUN 8 01/13/2007 0834   CREATININE 1.0 01/13/2007 0834   CALCIUM 9.0 01/13/2007 0834   PROT 6.8 01/13/2007 0834   ALBUMIN 4.0 01/13/2007 0834   AST 15 01/13/2007 0834   ALT 15 01/13/2007 0834   ALKPHOS 66 01/13/2007 0834   BILITOT 0.8 01/13/2007 0834   GFRNONAA 84 01/13/2007 0834   GFRAA 102 01/13/2007 0834     ASSESSMENT AND PLAN: 67 year old male with long-standing nondysplastic Barrett's due for surveillance.  He had a recent episode that was concerning for a possible arrhythmia or  other cardiac event.  I told him that he should see his PCP (he has an appointment next week) to discuss further evaluation.  I think it would be imprudent to schedule him for an elective sedated procedure if he may have an underlying cardiology issue. The patient will message me following his appointment to discuss what further evaluation is recommended. We will plan to do his EGD after all recommended cardiology evaluation is complete.  Will plan to use moderate sedation given suspected propofol allergy. I recommended he try decreasing his omeprazole dose to once daily  to see if this adequately controls his GERD symptoms.  Barrett's esophagus - EGD with moderated sedation pending potential further cardiac evaluation - Decrease omeprazole to once daily  Kurt Martinez E. Candis Schatz, MD [facility]  CC:  Gildardo Pounds, NP   ADDENDUM: EGD Report October 02, 2017 Dr. Renold Don [facility], [place], [place]) Indication:  Barrett's esophagus without dysplasia Findings: Esophagus:  Salmon colored mucosa from 32 cm to 38 cm, four quadrant biopsies taken at 2 cm intervals Stomach:  Medium sized hiatal hernia, multiple sessile polyps consistent with fundic gland polyps, not biopsied/removed Duodenum:  Normal  Path: Esophagus at 32 cm and 34 cm:  Barrett's mucosa without dysplasia  Colonoscopy Report October 02, 2017 Dr. Renold Don [facility], [place], [place]) Indication:  Colon cancer screening (average risk) Findings:  Mild diverticulosis of the right colon, moderate diverticulosis of the left colon 4 mm polyp in sigmoid colon Internal Hemorrhoids  Path: Tubular adenoma

## 2022-10-02 ENCOUNTER — Encounter: Payer: Self-pay | Admitting: Nurse Practitioner

## 2022-10-02 ENCOUNTER — Ambulatory Visit: Payer: Medicare Other | Attending: Nurse Practitioner | Admitting: Nurse Practitioner

## 2022-10-02 ENCOUNTER — Telehealth: Payer: Self-pay | Admitting: Gastroenterology

## 2022-10-02 ENCOUNTER — Other Ambulatory Visit: Payer: Self-pay

## 2022-10-02 VITALS — BP 135/79 | HR 68 | Ht 69.0 in | Wt 199.2 lb

## 2022-10-02 DIAGNOSIS — Z79899 Other long term (current) drug therapy: Secondary | ICD-10-CM | POA: Insufficient documentation

## 2022-10-02 DIAGNOSIS — L989 Disorder of the skin and subcutaneous tissue, unspecified: Secondary | ICD-10-CM | POA: Insufficient documentation

## 2022-10-02 DIAGNOSIS — Z0000 Encounter for general adult medical examination without abnormal findings: Secondary | ICD-10-CM | POA: Diagnosis not present

## 2022-10-02 DIAGNOSIS — F339 Major depressive disorder, recurrent, unspecified: Secondary | ICD-10-CM | POA: Insufficient documentation

## 2022-10-02 DIAGNOSIS — M5126 Other intervertebral disc displacement, lumbar region: Secondary | ICD-10-CM

## 2022-10-02 DIAGNOSIS — E781 Pure hyperglyceridemia: Secondary | ICD-10-CM | POA: Diagnosis not present

## 2022-10-02 DIAGNOSIS — G2581 Restless legs syndrome: Secondary | ICD-10-CM | POA: Diagnosis not present

## 2022-10-02 DIAGNOSIS — Z85828 Personal history of other malignant neoplasm of skin: Secondary | ICD-10-CM | POA: Insufficient documentation

## 2022-10-02 DIAGNOSIS — Z1159 Encounter for screening for other viral diseases: Secondary | ICD-10-CM | POA: Insufficient documentation

## 2022-10-02 DIAGNOSIS — R7989 Other specified abnormal findings of blood chemistry: Secondary | ICD-10-CM | POA: Insufficient documentation

## 2022-10-02 MED ORDER — BACLOFEN 10 MG PO TABS: 10.0000 mg | ORAL_TABLET | Freq: Every day | ORAL | 0 refills | Status: DC | PRN

## 2022-10-02 MED ORDER — TRAMADOL HCL 50 MG PO TABS: 50.0000 mg | ORAL_TABLET | Freq: Three times a day (TID) | ORAL | 2 refills | Status: DC | PRN

## 2022-10-02 MED ORDER — VENLAFAXINE HCL 37.5 MG PO TABS: 37.5000 mg | ORAL_TABLET | Freq: Two times a day (BID) | ORAL | 2 refills | Status: DC

## 2022-10-02 NOTE — Progress Notes (Signed)
Assessment & Plan:  Kurt Martinez was seen today for medication management.  Diagnoses and all orders for this visit:  Encounter for annual physical exam -     CMP14+EGFR -     Lipid panel -     CBC with Differential -     HCV Ab w Reflex to Quant PCR  Need for hepatitis C screening test -     HCV Ab w Reflex to Quant PCR  Abnormal CBC -     CBC with Differential  Hypertriglyceridemia -     Lipid panel  Skin lesion -     Ambulatory referral to Dermatology  Restless leg syndrome -     baclofen (LIORESAL) 10 MG tablet; Take 1-2 tablets (10-20 mg total) by mouth daily as needed. FOR BREAKTHROUGH PAIN  Recurrent major depressive disorder, remission status unspecified (HCC) -     venlafaxine (EFFEXOR) 37.5 MG tablet; Take 1 tablet (37.5 mg total) by mouth 2 (two) times daily.    Patient has been counseled on age-appropriate routine health concerns for screening and prevention. These are reviewed and up-to-date. Referrals have been placed accordingly. Immunizations are up-to-date or declined.    Subjective:   Chief Complaint  Patient presents with   Medication Management   HPI Kurt Martinez 67 y.o. male presents to office today for annual physical.  Has a skin lesion that needs to be evaluated by dermatology.  He reports the current skin lesion is in the same area as a previous basal cell carcinoma.    Would like to know if he can take 100 mg of Viagra instead of 50 mg.  States Viagra is not as effective at 50 mg due to his chronic use of venlafaxine   On February 21 he woke up fatigue, head feeling full and thick with difficulty holding it up.  He had to lie down all day and also at that time he felt pressure in his upper left chest that did not linger for a long duration of time.  Associated symptoms included shortness of breath which persisted for a few days after initial symptoms.  He does have a history of hypertriglyceridemia.  No history of diabetes, hypertension,  coronary artery disease.  EKG performed today was normal. BP Readings from Last 3 Encounters:  10/02/22 135/79  09/28/22 120/80  01/16/22 125/82     No Would like to know if he can take an extra tramadol for breakthrough restless leg pain.  I have declined any additional tramadol today however we will try baclofen to see if this will help.   Review of Systems  Constitutional:  Negative for fever, malaise/fatigue and weight loss.  HENT: Negative.  Negative for nosebleeds.   Eyes: Negative.  Negative for blurred vision, double vision and photophobia.  Respiratory: Negative.  Negative for cough and shortness of breath.   Cardiovascular: Negative.  Negative for chest pain, palpitations and leg swelling.  Gastrointestinal: Negative.  Negative for heartburn, nausea and vomiting.  Genitourinary: Negative.   Musculoskeletal: Negative.  Negative for myalgias.  Skin: Negative.   Neurological:  Positive for sensory change. Negative for dizziness, focal weakness, seizures and headaches.  Endo/Heme/Allergies: Negative.   Psychiatric/Behavioral: Negative.  Negative for suicidal ideas.     Past Medical History:  Diagnosis Date   Allergy    Anemia    Anxiety    Arthritis    Barretts esophagus    Cancer ([place])    Skin cancer   Depression  GERD (gastroesophageal reflux disease)    Restless leg syndrome    Sleep apnea     Past Surgical History:  Procedure Laterality Date   CHOLECYSTECTOMY     2007   MOUTH SURGERY     tooth removed   ROTATOR CUFF REPAIR Right    SKIN CANCER EXCISION     Basel cell 2008, 2003    Family History  Problem Relation Age of Onset   Brain cancer Mother    Cancer Father    Prostate cancer Brother    Colon cancer Neg Hx    Esophageal cancer Neg Hx    Rectal cancer Neg Hx    Stomach cancer Neg Hx     Social History Reviewed with no changes to be made today.   Outpatient Medications Prior to Visit  Medication Sig Dispense Refill   Misc. Devices  MISC Please provide patient with insurance approved cpap equipment (mask and hose). Faxed to [facility] in [place], [place]. ICD 10 G47.33 Z99.89 1 each 0   omeprazole (PRILOSEC) 20 MG capsule Take 1 capsule (20 mg total) by mouth 2 (two) times daily before a meal. 180 capsule 1   sildenafil (VIAGRA) 50 MG tablet Take 1-2 tablets (50-100 mg total) by mouth daily as needed for erectile dysfunction. 10 tablet 1   traMADol (ULTRAM) 50 MG tablet Take 1 tablet (50 mg total) by mouth every 8 (eight) hours as needed. 90 tablet 2   venlafaxine (EFFEXOR) 37.5 MG tablet TAKE 1 TABLET BY MOUTH 2 TIMES DAILY. 60 tablet 0   No facility-administered medications prior to visit.    Allergies  Allergen Reactions   Propofol Other (See Comments), Rash and Shortness Of Breath    Patient states he had an endoscopy procedure recently, was told by MD they had to oxygenate him for a while, had difficulty waking him and he developed rash\whelps        Objective:    BP 135/79   Pulse 68   Ht '5\' 9"'$  (1.753 m)   Wt 199 lb 3.2 oz (90.4 kg)   SpO2 96%   BMI 29.42 kg/m  Wt Readings from Last 3 Encounters:  10/02/22 199 lb 3.2 oz (90.4 kg)  09/28/22 197 lb (89.4 kg)  07/12/22 200 lb (90.7 kg)    Physical Exam Constitutional:      Appearance: He is well-developed.  HENT:     Head: Normocephalic and atraumatic.     Right Ear: Hearing, tympanic membrane, ear canal and external ear normal.     Left Ear: Hearing, tympanic membrane, ear canal and external ear normal.     Nose: Nose normal. No mucosal edema or rhinorrhea.     Right Turbinates: Not enlarged.     Left Turbinates: Not enlarged.     Mouth/Throat:     Lips: Pink.     Mouth: Mucous membranes are moist.     Dentition: No gingival swelling, dental abscesses or gum lesions.     Pharynx: Uvula midline.     Tonsils: No tonsillar exudate. 1+ on the right. 1+ on the left.  Eyes:     General: Lids are normal. No scleral  icterus.    Extraocular Movements: Extraocular movements intact.     Conjunctiva/sclera: Conjunctivae normal.     Pupils: Pupils are equal, round, and reactive to light.  Neck:     Thyroid: No thyromegaly.     Trachea: No tracheal deviation.  Cardiovascular:     Rate  and Rhythm: Normal rate and regular rhythm.     Heart sounds: Normal heart sounds. No murmur heard.    No friction rub. No gallop.  Pulmonary:     Effort: Pulmonary effort is normal. No respiratory distress.     Breath sounds: Normal breath sounds. No wheezing or rales.  Chest:     Chest wall: No mass or tenderness.  Breasts:    Right: No inverted nipple, mass, nipple discharge, skin change or tenderness.     Left: No inverted nipple, mass, nipple discharge, skin change or tenderness.  Abdominal:     General: Bowel sounds are normal. There is no distension.     Palpations: Abdomen is soft. There is no mass.     Tenderness: There is no abdominal tenderness. There is no guarding or rebound.  Musculoskeletal:        General: No tenderness or deformity. Normal range of motion.     Cervical back: Normal range of motion and neck supple.  Lymphadenopathy:     Cervical: No cervical adenopathy.  Skin:    General: Skin is warm and dry.     Capillary Refill: Capillary refill takes less than 2 seconds.     Findings: No erythema.  Neurological:     Mental Status: He is alert and oriented to person, place, and time.     Cranial Nerves: No cranial nerve deficit.     Sensory: Sensation is intact.     Motor: No abnormal muscle tone.     Coordination: Coordination is intact. Coordination normal.     Gait: Gait is intact.     Deep Tendon Reflexes: Reflexes normal.     Reflex Scores:      Patellar reflexes are 1+ on the right side and 1+ on the left side. Psychiatric:        Attention and Perception: Attention normal.        Mood and Affect: Mood normal.        Speech: Speech normal.        Behavior: Behavior normal.         Thought Content: Thought content normal.        Judgment: Judgment normal.          Patient has been counseled extensively about nutrition and exercise as well as the importance of adherence with medications and regular follow-up. The patient was given clear instructions to go to ER or return to medical center if symptoms don't improve, worsen or new problems develop. The patient verbalized understanding.   Follow-up: Return if symptoms worsen or fail to improve.   Gildardo Pounds, FNP-BC [facility] [place], [place]   10/02/2022, 11:57 AM

## 2022-10-02 NOTE — Telephone Encounter (Signed)
PT called to schedule EGD. He is scheduled for 4/26 '@2pm'$ 

## 2022-10-03 LAB — CBC WITH DIFFERENTIAL/PLATELET
Basophils Absolute: 0.1 10*3/uL (ref 0.0–0.2)
Basos: 1 %
EOS (ABSOLUTE): 0.2 10*3/uL (ref 0.0–0.4)
Eos: 4 %
Hematocrit: 45.9 % (ref 37.5–51.0)
Hemoglobin: 15.9 g/dL (ref 13.0–17.7)
Immature Grans (Abs): 0 10*3/uL (ref 0.0–0.1)
Immature Granulocytes: 0 %
Lymphocytes Absolute: 1.5 10*3/uL (ref 0.7–3.1)
Lymphs: 32 %
MCH: 30.9 pg (ref 26.6–33.0)
MCHC: 34.6 g/dL (ref 31.5–35.7)
MCV: 89 fL (ref 79–97)
Monocytes Absolute: 0.6 10*3/uL (ref 0.1–0.9)
Monocytes: 12 %
Neutrophils Absolute: 2.4 10*3/uL (ref 1.4–7.0)
Neutrophils: 51 %
Platelets: 199 10*3/uL (ref 150–450)
RBC: 5.14 x10E6/uL (ref 4.14–5.80)
RDW: 12.1 % (ref 11.6–15.4)
WBC: 4.7 10*3/uL (ref 3.4–10.8)

## 2022-10-03 LAB — CMP14+EGFR
ALT: 13 IU/L (ref 0–44)
AST: 13 IU/L (ref 0–40)
Albumin/Globulin Ratio: 1.6 (ref 1.2–2.2)
Albumin: 4.2 g/dL (ref 3.9–4.9)
Alkaline Phosphatase: 87 IU/L (ref 44–121)
BUN/Creatinine Ratio: 10 (ref 10–24)
BUN: 12 mg/dL (ref 8–27)
Bilirubin Total: 0.5 mg/dL (ref 0.0–1.2)
CO2: 26 mmol/L (ref 20–29)
Calcium: 9.1 mg/dL (ref 8.6–10.2)
Chloride: 102 mmol/L (ref 96–106)
Creatinine, Ser: 1.21 mg/dL (ref 0.76–1.27)
Globulin, Total: 2.7 g/dL (ref 1.5–4.5)
Glucose: 87 mg/dL (ref 70–99)
Potassium: 4.5 mmol/L (ref 3.5–5.2)
Sodium: 141 mmol/L (ref 134–144)
Total Protein: 6.9 g/dL (ref 6.0–8.5)
eGFR: 66 mL/min/{1.73_m2} (ref >59)

## 2022-10-03 LAB — HCV INTERPRETATION

## 2022-10-03 LAB — LIPID PANEL
Chol/HDL Ratio: 4 ratio (ref 0.0–5.0)
Cholesterol, Total: 178 mg/dL (ref 100–199)
HDL: 45 mg/dL (ref >39)
LDL Chol Calc (NIH): 97 mg/dL (ref 0–99)
Triglycerides: 211 mg/dL — ABNORMAL HIGH (ref 0–149)
VLDL Cholesterol Cal: 36 mg/dL (ref 5–40)

## 2022-10-03 LAB — HCV AB W REFLEX TO QUANT PCR: HCV Ab: NONREACTIVE

## 2022-10-04 NOTE — Telephone Encounter (Signed)
Pt called in and scheduled EGD with schedulers. Per last OV note it stated pt needed to have cardiac work-up. Pt was seen by Kurt Rankins NP and EKG was done. Is it ok to proceed with EGD that pt scheduled or does he need cardiac clearance first. Please advise

## 2022-10-08 ENCOUNTER — Other Ambulatory Visit: Payer: Self-pay | Admitting: Nurse Practitioner

## 2022-10-08 DIAGNOSIS — K219 Gastro-esophageal reflux disease without esophagitis: Secondary | ICD-10-CM

## 2022-10-10 ENCOUNTER — Other Ambulatory Visit: Payer: Self-pay | Admitting: Nurse Practitioner

## 2022-10-10 DIAGNOSIS — G2581 Restless legs syndrome: Secondary | ICD-10-CM

## 2022-10-16 ENCOUNTER — Other Ambulatory Visit: Payer: Self-pay

## 2022-10-16 DIAGNOSIS — K219 Gastro-esophageal reflux disease without esophagitis: Secondary | ICD-10-CM

## 2022-10-16 DIAGNOSIS — K2270 Barrett's esophagus without dysplasia: Secondary | ICD-10-CM

## 2022-10-17 NOTE — Telephone Encounter (Signed)
Pt scheduled for  EGD in the [facility] 11/23/22 at 2pm. Pt aware of appt, instructions mailed to pt. Amb referral entered in epic.

## 2022-10-24 ENCOUNTER — Encounter: Payer: Self-pay | Admitting: Nurse Practitioner

## 2022-10-24 DIAGNOSIS — F329 Major depressive disorder, single episode, unspecified: Secondary | ICD-10-CM | POA: Diagnosis not present

## 2022-11-12 ENCOUNTER — Telehealth: Payer: Self-pay | Admitting: Nurse Practitioner

## 2022-11-12 NOTE — Telephone Encounter (Signed)
Called patient to schedule Medicare Annual Wellness Visit (AWV). Left message for patient to call back and schedule Medicare Annual Wellness Visit (AWV).  Last date of AWV: *awvi 07/30/22 per [facility]   If any questions, please contact me at [phone].  Thank you ,  Rudell Cobb AWV direct phone # [phone]

## 2022-11-12 NOTE — Telephone Encounter (Signed)
Contacted Kurt Martinez to schedule their annual wellness visit. Appointment made for 11/16/22.  Kurt Martinez AWV direct phone # [phone]

## 2022-11-16 ENCOUNTER — Ambulatory Visit: Payer: Medicare Other | Attending: Nurse Practitioner

## 2022-11-16 VITALS — Ht 69.0 in | Wt 190.0 lb

## 2022-11-16 DIAGNOSIS — Z0000 Encounter for general adult medical examination without abnormal findings: Secondary | ICD-10-CM | POA: Diagnosis not present

## 2022-11-16 NOTE — Progress Notes (Signed)
I connected with  Kurt Martinez on 11/16/22 by a audio enabled telemedicine application and verified that I am speaking with the correct person using two identifiers.  Patient Location: Home  Provider Location: Office/Clinic  I discussed the limitations of evaluation and management by telemedicine. The patient expressed understanding and agreed to proceed.  Subjective:   Kurt Martinez is a 67 y.o. male who presents for an Initial Medicare Annual Wellness Visit.  Review of Systems     Cardiac Risk Factors include: advanced age (>74men, >82 women);male gender     Objective:    Today's Vitals   11/16/22 1457  Weight: 190 lb (86.2 kg)  Height:  (1.753 m)   Body mass index is 28.06 kg/m.     11/16/2022    3:04 PM  Advanced Directives  Does Patient Have a Medical Advance Directive? Yes  Type of Estate agent of [place];Living will  Copy of Healthcare Power of Attorney in Chart? No - copy requested    Current Medications (verified) Outpatient Encounter Medications as of 11/16/2022  Medication Sig   baclofen (LIORESAL) 10 MG tablet TAKE 1-2 TABLETS (10-20 MG TOTAL) BY MOUTH DAILY AS NEEDED. FOR BREAKTHROUGH PAIN   omeprazole (PRILOSEC) 20 MG capsule TAKE 1 CAPSULE (20 MG TOTAL) BY MOUTH 2 (TWO) TIMES DAILY BEFORE A MEAL. (Patient taking differently: Take 20 mg by mouth daily.)   sildenafil (VIAGRA) 50 MG tablet Take 1-2 tablets (50-100 mg total) by mouth daily as needed for erectile dysfunction.   traMADol (ULTRAM) 50 MG tablet Take 1 tablet (50 mg total) by mouth every 8 (eight) hours as needed.   venlafaxine (EFFEXOR) 37.5 MG tablet Take 1 tablet (37.5 mg total) by mouth 2 (two) times daily.   Misc. Devices MISC Please provide patient with insurance approved cpap equipment (mask and hose). Faxed to [phone] [facility] in [place], [place]. ICD 10 G47.33 Z99.89   No facility-administered encounter medications on file as of  11/16/2022.    Allergies (verified) Propofol   History: Past Medical History:  Diagnosis Date   Allergy    Anemia    Anxiety    Arthritis    Barretts esophagus    Cancer    Skin cancer   Depression    GERD (gastroesophageal reflux disease)    Restless leg syndrome    Sleep apnea    Past Surgical History:  Procedure Laterality Date   CHOLECYSTECTOMY     2007   MOUTH SURGERY     tooth removed   ROTATOR CUFF REPAIR Right    SKIN CANCER EXCISION     Basel cell 2008, 2003   Family History  Problem Relation Age of Onset   Brain cancer Mother    Cancer Father    Prostate cancer Brother    Colon cancer Neg Hx    Esophageal cancer Neg Hx    Rectal cancer Neg Hx    Stomach cancer Neg Hx    Social History   Socioeconomic History   Marital status: Divorced    Spouse name: Not on file   Number of children: Not on file   Years of education: Not on file   Highest education level: Not on file  Occupational History   Not on file  Tobacco Use   Smoking status: Never   Smokeless tobacco: Never  Vaping Use   Vaping Use: Never used  Substance and Sexual Activity   Alcohol use: Yes    Comment: once  a month   Drug use: Never   Sexual activity: Not Currently  Other Topics Concern   Not on file  Social History Narrative   Not on file   Social Determinants of Health   Financial Resource Strain: Low Risk  (11/16/2022)   Overall Financial Resource Strain (CARDIA)    Difficulty of Paying Living Expenses: Not hard at all  Food Insecurity: No Food Insecurity (11/16/2022)   Hunger Vital Sign    Worried About Running Out of Food in the Last Year: Never true    Ran Out of Food in the Last Year: Never true  Transportation Needs: No Transportation Needs (11/16/2022)   PRAPARE - Administrator, Civil Service (Medical): No    Lack of Transportation (Non-Medical): No  Physical Activity: Sufficiently Active (11/16/2022)   Exercise Vital Sign    Days of Exercise per  Week: 7 days    Minutes of Exercise per Session: 90 min  Stress: No Stress Concern Present (11/16/2022)   [employer] of Occupational Health - Occupational Stress Questionnaire    Feeling of Stress : Not at all  Social Connections: Not on file    Tobacco Counseling Counseling given: Not Answered   Clinical Intake:  Pre-visit preparation completed: Yes  Pain : No/denies pain     Nutritional Status: BMI 25 -29 Overweight Nutritional Risks: None Diabetes: No  How often do you need to have someone help you when you read instructions, pamphlets, or other written materials from your doctor or pharmacy?: 1 - Never  Diabetic? no  Interpreter Needed?: No  Information entered by :: NAllen LPN   Activities of Daily Living    11/16/2022    3:06 PM  In your present state of health, do you have any difficulty performing the following activities:  Hearing? 0  Vision? 0  Difficulty concentrating or making decisions? 0  Walking or climbing stairs? 0  Dressing or bathing? 0  Doing errands, shopping? 0  Preparing Food and eating ? N  Using the Toilet? N  In the past six months, have you accidently leaked urine? N  Do you have problems with loss of bowel control? N  Managing your Medications? N  Managing your Finances? N  Housekeeping or managing your Housekeeping? N    Patient Care Team: Claiborne Rigg, NP as PCP - General (Nurse Practitioner)  Indicate any recent Medical Services you may have received from other than [facility] providers in the past year (date may be approximate).     Assessment:   This is a routine wellness examination for Kurt Martinez.  Hearing/Vision screen Vision Screening - Comments:: Regular eye exams, Dr. Carleene Mains  Dietary issues and exercise activities discussed: Current Exercise Habits: Home exercise routine, Type of exercise: Other - see comments (cycles), Time (Minutes): > 60, Frequency (Times/Week): 7, Weekly Exercise (Minutes/Week): 0    Goals Addressed             This Visit's Progress    Patient Stated       11/16/2022, wants to wean off effexor       Depression Screen    11/16/2022    3:05 PM 10/02/2022   11:16 AM 11/27/2021    9:14 AM 11/27/2021    9:13 AM  PHQ 2/9 Scores  PHQ - 2 Score 0 1 1 1   PHQ- 9 Score  6      Fall Risk    11/16/2022    3:05 PM 10/02/2022   11:14 AM  11/27/2021    9:13 AM  Fall Risk   Falls in the past year? 0 0 0  Number falls in past yr: 0 0 0  Injury with Fall? 0 0 0  Risk for fall due to : Medication side effect No Fall Risks No Fall Risks  Follow up Falls prevention discussed;Education provided;Falls evaluation completed Falls evaluation completed     FALL RISK PREVENTION PERTAINING TO THE HOME:  Any stairs in or around the home? No  If so, are there any without handrails?  N/a Home free of loose throw rugs in walkways, pet beds, electrical cords, etc? Yes  Adequate lighting in your home to reduce risk of falls? Yes   ASSISTIVE DEVICES UTILIZED TO PREVENT FALLS:  Life alert? No  Use of a cane, walker or w/c? No  Grab bars in the bathroom? Yes  Shower chair or bench in shower? No  Elevated toilet seat or a handicapped toilet? No   TIMED UP AND GO:  Was the test performed? No .      Cognitive Function:        11/16/2022    3:08 PM  6CIT Screen  What Year? 0 points  What month? 0 points  What time? 0 points  Count back from 20 0 points  Months in reverse 0 points  Repeat phrase 0 points  Total Score 0 points    Immunizations Immunization History  Administered Date(s) Administered   Covid-19, Mrna,Vaccine(Spikevax)59yrs and older 04/16/2022   Fluad Quad(high Dose 65+) 08/15/2022   Influenza Split 04/30/2012   PFIZER(Purple Top)SARS-COV-2 Vaccination 06/18/2020   Pneumococcal Polysaccharide-23 02/19/2022   Tdap 10/07/2018   Zoster Recombinat (Shingrix) 10/09/2018, 01/21/2019    TDAP status: Up to date  Flu Vaccine status: Up to date  Pneumococcal  vaccine status: Up to date  Covid-19 vaccine status: Completed vaccines  Qualifies for Shingles Vaccine? Yes   Zostavax completed Yes   Shingrix Completed?: Yes  Screening Tests Health Maintenance  Topic Date Due   Medicare Annual Wellness (AWV)  Never done   Pneumonia Vaccine 51+ Years old (2 of 2 - PCV) 02/20/2023   INFLUENZA VACCINE  02/28/2023   COLONOSCOPY (Pts 45-68yrs Insurance coverage will need to be confirmed)  10/03/2027   DTaP/Tdap/Td (2 - Td or Tdap) 10/06/2028   COVID-19 Vaccine  Completed   Hepatitis C Screening  Completed   Zoster Vaccines- Shingrix  Completed   HPV VACCINES  Aged Out    Health Maintenance  Health Maintenance Due  Topic Date Due   Medicare Annual Wellness (AWV)  Never done    Colorectal cancer screening: Type of screening: Colonoscopy. Completed 10/02/2017. Repeat every 3 years  Lung Cancer Screening: (Low Dose CT Chest recommended if Age 51-80 years, 30 pack-year currently smoking OR have quit w/in 15years.) does not qualify.   Lung Cancer Screening Referral: no  Additional Screening:  Hepatitis C Screening: does qualify; Completed 10/02/2022  Vision Screening: Recommended annual ophthalmology exams for early detection of glaucoma and other disorders of the eye. Is the patient up to date with their annual eye exam?  Yes  Who is the provider or what is the name of the office in which the patient attends annual eye exams? Dr. Rhea Belton If pt is not established with a provider, would they like to be referred to a provider to establish care? No .   Dental Screening: Recommended annual dental exams for proper oral hygiene  Community Resource Referral / Chronic Care Management: CRR required  this visit?  No   CCM required this visit?  No      Plan:     I have personally reviewed and noted the following in the patient's chart:   Medical and social history Use of alcohol, tobacco or illicit drugs  Current medications and supplements  including opioid prescriptions. Patient is not currently taking opioid prescriptions. Functional ability and status Nutritional status Physical activity Advanced directives List of other physicians Hospitalizations, surgeries, and ER visits in previous 12 months Vitals Screenings to include cognitive, depression, and falls Referrals and appointments  In addition, I have reviewed and discussed with patient certain preventive protocols, quality metrics, and best practice recommendations. A written personalized care plan for preventive services as well as general preventive health recommendations were provided to patient.     Barb Merino, LPN   [id number]   Nurse Notes: none  Due to this being a virtual visit, the after visit summary with patients personalized plan was offered to patient via mail or my-chart.  Patient would like to access on my-chart

## 2022-11-16 NOTE — Patient Instructions (Signed)
Mr. Kurt Martinez , Thank you for taking time to come for your Medicare Wellness Visit. I appreciate your ongoing commitment to your health goals. Please review the following plan we discussed and let me know if I can assist you in the future.   These are the goals we discussed:  Goals      Patient Stated     11/16/2022, wants to wean off effexor        This is a list of the screening recommended for you and due dates:  Health Maintenance  Topic Date Due   Pneumonia Vaccine (2 of 2 - PCV) 02/20/2023   Flu Shot  02/28/2023   Medicare Annual Wellness Visit  11/16/2023   Colon Cancer Screening  10/03/2027   DTaP/Tdap/Td vaccine (2 - Td or Tdap) 10/06/2028   COVID-19 Vaccine  Completed   Hepatitis C Screening: USPSTF Recommendation to screen - Ages 93-79 yo.  Completed   Zoster (Shingles) Vaccine  Completed   HPV Vaccine  Aged Out    Advanced directives: Please bring a copy of your POA (Power of [place]) and/or Living Will to your next appointment.   Conditions/risks identified: none  Next appointment: Follow up in one year for your annual wellness visit.   Preventive Care 40 Years and Older, Male  Preventive care refers to lifestyle choices and visits with your health care provider that can promote health and wellness. What does preventive care include? A yearly physical exam. This is also called an annual well check. Dental exams once or twice a year. Routine eye exams. Ask your health care provider how often you should have your eyes checked. Personal lifestyle choices, including: Daily care of your teeth and gums. Regular physical activity. Eating a healthy diet. Avoiding tobacco and drug use. Limiting alcohol use. Practicing safe sex. Taking low doses of aspirin every day. Taking vitamin and mineral supplements as recommended by your health care provider. What happens during an annual well check? The services and screenings done by your health care provider during your  annual well check will depend on your age, overall health, lifestyle risk factors, and family history of disease. Counseling  Your health care provider may ask you questions about your: Alcohol use. Tobacco use. Drug use. Emotional well-being. Home and relationship well-being. Sexual activity. Eating habits. History of falls. Memory and ability to understand (cognition). Work and work Astronomer. Screening  You may have the following tests or measurements: Height, weight, and BMI. Blood pressure. Lipid and cholesterol levels. These may be checked every 5 years, or more frequently if you are over 10 years old. Skin check. Lung cancer screening. You may have this screening every year starting at age 7 if you have a 30-pack-year history of smoking and currently smoke or have quit within the past 15 years. Fecal occult blood test (FOBT) of the stool. You may have this test every year starting at age 48. Flexible sigmoidoscopy or colonoscopy. You may have a sigmoidoscopy every 5 years or a colonoscopy every 10 years starting at age 45. Prostate cancer screening. Recommendations will vary depending on your family history and other risks. Hepatitis C blood test. Hepatitis B blood test. Sexually transmitted disease (STD) testing. Diabetes screening. This is done by checking your blood sugar (glucose) after you have not eaten for a while (fasting). You may have this done every 1-3 years. Abdominal aortic aneurysm (AAA) screening. You may need this if you are a current or former smoker. Osteoporosis. You may be screened starting  at age 32 if you are at high risk. Talk with your health care provider about your test results, treatment options, and if necessary, the need for more tests. Vaccines  Your health care provider may recommend certain vaccines, such as: Influenza vaccine. This is recommended every year. Tetanus, diphtheria, and acellular pertussis (Tdap, Td) vaccine. You may need a Td  booster every 10 years. Zoster vaccine. You may need this after age 22. Pneumococcal 13-valent conjugate (PCV13) vaccine. One dose is recommended after age 69. Pneumococcal polysaccharide (PPSV23) vaccine. One dose is recommended after age 32. Talk to your health care provider about which screenings and vaccines you need and how often you need them. This information is not intended to replace advice given to you by your health care provider. Make sure you discuss any questions you have with your health care provider. Document Released: 08/12/2015 Document Revised: 04/04/2016 Document Reviewed: 05/17/2015 Elsevier Interactive Patient Education  2017 [place] Prevention in the Home Falls can cause injuries. They can happen to people of all ages. There are many things you can do to make your home safe and to help prevent falls. What can I do on the outside of my home? Regularly fix the edges of walkways and driveways and fix any cracks. Remove anything that might make you trip as you walk through a door, such as a raised step or threshold. Trim any bushes or trees on the path to your home. Use bright outdoor lighting. Clear any walking paths of anything that might make someone trip, such as rocks or tools. Regularly check to see if handrails are loose or broken. Make sure that both sides of any steps have handrails. Any raised decks and porches should have guardrails on the edges. Have any leaves, snow, or ice cleared regularly. Use sand or salt on walking paths during winter. Clean up any spills in your garage right away. This includes oil or grease spills. What can I do in the bathroom? Use night lights. Install grab bars by the toilet and in the tub and shower. Do not use towel bars as grab bars. Use non-skid mats or decals in the tub or shower. If you need to sit down in the shower, use a plastic, non-slip stool. Keep the floor dry. Clean up any water that spills on the floor  as soon as it happens. Remove soap buildup in the tub or shower regularly. Attach bath mats securely with double-sided non-slip rug tape. Do not have throw rugs and other things on the floor that can make you trip. What can I do in the bedroom? Use night lights. Make sure that you have a light by your bed that is easy to reach. Do not use any sheets or blankets that are too big for your bed. They should not hang down onto the floor. Have a firm chair that has side arms. You can use this for support while you get dressed. Do not have throw rugs and other things on the floor that can make you trip. What can I do in the kitchen? Clean up any spills right away. Avoid walking on wet floors. Keep items that you use a lot in easy-to-reach places. If you need to reach something above you, use a strong step stool that has a grab bar. Keep electrical cords out of the way. Do not use floor polish or wax that makes floors slippery. If you must use wax, use non-skid floor wax. Do not have throw rugs  and other things on the floor that can make you trip. What can I do with my stairs? Do not leave any items on the stairs. Make sure that there are handrails on both sides of the stairs and use them. Fix handrails that are broken or loose. Make sure that handrails are as long as the stairways. Check any carpeting to make sure that it is firmly attached to the stairs. Fix any carpet that is loose or worn. Avoid having throw rugs at the top or bottom of the stairs. If you do have throw rugs, attach them to the floor with carpet tape. Make sure that you have a light switch at the top of the stairs and the bottom of the stairs. If you do not have them, ask someone to add them for you. What else can I do to help prevent falls? Wear shoes that: Do not have high heels. Have rubber bottoms. Are comfortable and fit you well. Are closed at the toe. Do not wear sandals. If you use a stepladder: Make sure that it is  fully opened. Do not climb a closed stepladder. Make sure that both sides of the stepladder are locked into place. Ask someone to hold it for you, if possible. Clearly mark and make sure that you can see: Any grab bars or handrails. First and last steps. Where the edge of each step is. Use tools that help you move around (mobility aids) if they are needed. These include: Canes. Walkers. Scooters. Crutches. Turn on the lights when you go into a dark area. Replace any light bulbs as soon as they burn out. Set up your furniture so you have a clear path. Avoid moving your furniture around. If any of your floors are uneven, fix them. If there are any pets around you, be aware of where they are. Review your medicines with your doctor. Some medicines can make you feel dizzy. This can increase your chance of falling. Ask your doctor what other things that you can do to help prevent falls. This information is not intended to replace advice given to you by your health care provider. Make sure you discuss any questions you have with your health care provider. Document Released: 05/12/2009 Document Revised: 12/22/2015 Document Reviewed: 08/20/2014 Elsevier Interactive Patient Education  2017 [employer].

## 2022-11-22 DIAGNOSIS — F329 Major depressive disorder, single episode, unspecified: Secondary | ICD-10-CM | POA: Diagnosis not present

## 2022-11-23 ENCOUNTER — Encounter: Payer: Self-pay | Admitting: Gastroenterology

## 2022-11-23 ENCOUNTER — Ambulatory Visit (AMBULATORY_SURGERY_CENTER): Payer: Medicare Other | Admitting: Gastroenterology

## 2022-11-23 VITALS — BP 122/74 | HR 56 | Temp 97.3°F | Resp 11 | Ht 69.0 in | Wt 200.0 lb

## 2022-11-23 DIAGNOSIS — D131 Benign neoplasm of stomach: Secondary | ICD-10-CM

## 2022-11-23 DIAGNOSIS — K1370 Unspecified lesions of oral mucosa: Secondary | ICD-10-CM

## 2022-11-23 DIAGNOSIS — K449 Diaphragmatic hernia without obstruction or gangrene: Secondary | ICD-10-CM | POA: Diagnosis not present

## 2022-11-23 DIAGNOSIS — K2270 Barrett's esophagus without dysplasia: Secondary | ICD-10-CM | POA: Diagnosis not present

## 2022-11-23 DIAGNOSIS — K317 Polyp of stomach and duodenum: Secondary | ICD-10-CM | POA: Diagnosis not present

## 2022-11-23 MED ORDER — SODIUM CHLORIDE 0.9 % IV SOLN: 500.0000 mL | INTRAVENOUS | Status: AC

## 2022-11-23 NOTE — Progress Notes (Signed)
[facility] History and Physical   Primary Care Physician:  Claiborne Rigg, NP   Reason for Procedure:   Barrett's surveillance  Plan:    EGD with moderate sedation (propofol allergy)     HPI: Kurt Martinez is a 67 y.o. male undergoing Barrett's surveillance.  He has had Barrett's since 2007 with his last EGD in 2019 with 4 cm of circumferential Barrett's, no dysplasia.  His GERD symptoms have been well controlled on twice daily omeprazole for many years.  I recommended he decrease his dose to once daily at his last visit in March and he reports his symptoms are adequately controlled with once daily dosing.  Past Medical History:  Diagnosis Date   Allergy    Anemia    Anxiety    Arthritis    Barretts esophagus    Cancer (HCC)    Skin cancer   Depression    GERD (gastroesophageal reflux disease)    Restless leg syndrome    Sleep apnea     Past Surgical History:  Procedure Laterality Date   CHOLECYSTECTOMY     2007   MOUTH SURGERY     tooth removed   ROTATOR CUFF REPAIR Right    SKIN CANCER EXCISION     Basel cell 2008, 2003    Prior to Admission medications   Medication Sig Start Date End Date Taking? Authorizing Provider  Misc. Devices MISC Please provide patient with insurance approved cpap equipment (mask and hose). Faxed to [phone] [facility] in [place], [place]. ICD 10 G47.33 Z99.89 12/15/21  Yes Claiborne Rigg, NP  omeprazole (PRILOSEC) 20 MG capsule TAKE 1 CAPSULE (20 MG TOTAL) BY MOUTH 2 (TWO) TIMES DAILY BEFORE A MEAL. Patient taking differently: Take 20 mg by mouth daily. 10/08/22  Yes Hoy Register, MD  traMADol (ULTRAM) 50 MG tablet Take 1 tablet (50 mg total) by mouth every 8 (eight) hours as needed. 10/02/22  Yes Claiborne Rigg, NP  venlafaxine (EFFEXOR) 37.5 MG tablet Take 1 tablet (37.5 mg total) by mouth 2 (two) times daily. 10/02/22  Yes Claiborne Rigg, NP  baclofen (LIORESAL) 10 MG tablet TAKE 1-2 TABLETS (10-20  MG TOTAL) BY MOUTH DAILY AS NEEDED. FOR BREAKTHROUGH PAIN 10/11/22   Hoy Register, MD  sildenafil (VIAGRA) 50 MG tablet Take 1-2 tablets (50-100 mg total) by mouth daily as needed for erectile dysfunction. 08/13/22   Claiborne Rigg, NP    Current Outpatient Medications  Medication Sig Dispense Refill   Misc. Devices MISC Please provide patient with insurance approved cpap equipment (mask and hose). Faxed to [phone] [facility] in [place], [place]. ICD 10 G47.33 Z99.89 1 each 0   omeprazole (PRILOSEC) 20 MG capsule TAKE 1 CAPSULE (20 MG TOTAL) BY MOUTH 2 (TWO) TIMES DAILY BEFORE A MEAL. (Patient taking differently: Take 20 mg by mouth daily.) 180 capsule 1   traMADol (ULTRAM) 50 MG tablet Take 1 tablet (50 mg total) by mouth every 8 (eight) hours as needed. 90 tablet 2   venlafaxine (EFFEXOR) 37.5 MG tablet Take 1 tablet (37.5 mg total) by mouth 2 (two) times daily. 180 tablet 2   baclofen (LIORESAL) 10 MG tablet TAKE 1-2 TABLETS (10-20 MG TOTAL) BY MOUTH DAILY AS NEEDED. FOR BREAKTHROUGH PAIN 180 tablet 0   sildenafil (VIAGRA) 50 MG tablet Take 1-2 tablets (50-100 mg total) by mouth daily as needed for erectile dysfunction. 10 tablet 1   Current Facility-Administered Medications  Medication Dose Route Frequency Provider Last  Rate Last Admin   0.9 %  sodium chloride infusion  500 mL Intravenous Continuous Jenel Lucks, MD        Allergies as of 11/23/2022 - Review Complete 11/23/2022  Allergen Reaction Noted   Propofol Other (See Comments), Rash, and Shortness Of Breath 06/17/2013    Family History  Problem Relation Age of Onset   Brain cancer Mother    Cancer Father    Prostate cancer Brother    Colon cancer Neg Hx    Esophageal cancer Neg Hx    Rectal cancer Neg Hx    Stomach cancer Neg Hx     Social History   Socioeconomic History   Marital status: Divorced    Spouse name: Not on file   Number of children: Not on file   Years of education: Not  on file   Highest education level: Not on file  Occupational History   Not on file  Tobacco Use   Smoking status: Never   Smokeless tobacco: Never  Vaping Use   Vaping Use: Never used  Substance and Sexual Activity   Alcohol use: Yes    Comment: once a month   Drug use: Never   Sexual activity: Not Currently  Other Topics Concern   Not on file  Social History Narrative   Not on file   Social Determinants of Health   Financial Resource Strain: Low Risk  (11/16/2022)   Overall Financial Resource Strain (CARDIA)    Difficulty of Paying Living Expenses: Not hard at all  Food Insecurity: No Food Insecurity (11/16/2022)   Hunger Vital Sign    Worried About Running Out of Food in the Last Year: Never true    Ran Out of Food in the Last Year: Never true  Transportation Needs: No Transportation Needs (11/16/2022)   PRAPARE - Administrator, Civil Service (Medical): No    Lack of Transportation (Non-Medical): No  Physical Activity: Sufficiently Active (11/16/2022)   Exercise Vital Sign    Days of Exercise per Week: 7 days    Minutes of Exercise per Session: 90 min  Stress: No Stress Concern Present (11/16/2022)   [employer] of Occupational Health - Occupational Stress Questionnaire    Feeling of Stress : Not at all  Social Connections: Not on file  Intimate Partner Violence: Not on file    Review of Systems:  All other review of systems negative except as mentioned in the HPI.  Physical Exam: Vital signs BP (!) 143/101   Pulse 65   Temp (!) 97.3 F (36.3 C) (Skin)   Ht 5\' 9"  (1.753 m)   Wt 200 lb (90.7 kg)   SpO2 97%   BMI 29.53 kg/m   General:   Alert,  Well-developed, well-nourished, pleasant and cooperative in NAD Airway:  Mallampati 2 Lungs:  Clear throughout to auscultation.   Heart:  Regular rate and rhythm; no murmurs, clicks, rubs,  or gallops. Abdomen:  Soft, nontender and nondistended. Normal bowel sounds.   Neuro/Psych:  Normal mood and  affect. A and O x 3   Ronin Rehfeldt E. Tomasa Rand, MD [facility]

## 2022-11-23 NOTE — Progress Notes (Signed)
Called to room to assist during endoscopic procedure.  Patient ID and intended procedure confirmed with present staff. Received instructions for my participation in the procedure from the performing physician.  

## 2022-11-23 NOTE — Progress Notes (Signed)
Sedate, gd SR, tolerated procedure well, VSS, report to RN 

## 2022-11-23 NOTE — Progress Notes (Signed)
Pt reports propofol allergy and requests Versed/Fentayl sedation; Dr. Tomasa Rand aware

## 2022-11-23 NOTE — Progress Notes (Signed)
Pt. states no medical or surgical changes since previsit or office visit. 

## 2022-11-23 NOTE — Patient Instructions (Signed)
Resume previous diet and medications, including omeprazole. Awaiting pathology results.  Repeat upper endoscopy in 3 years for surveillance of Barrett's Esophagus, sooner if any dysplasia is sen on biopsies.  YOU HAD AN ENDOSCOPIC PROCEDURE TODAY AT THE [facility]:   Refer to the procedure report that was given to you for any specific questions about what was found during the examination.  If the procedure report does not answer your questions, please call your gastroenterologist to clarify.  If you requested that your care partner not be given the details of your procedure findings, then the procedure report has been included in a sealed envelope for you to review at your convenience later.  YOU SHOULD EXPECT: Some feelings of bloating in the abdomen. Passage of more gas than usual.  Walking can help get rid of the air that was put into your GI tract during the procedure and reduce the bloating. If you had a lower endoscopy (such as a colonoscopy or flexible sigmoidoscopy) you may notice spotting of blood in your stool or on the toilet paper. If you underwent a bowel prep for your procedure, you may not have a normal bowel movement for a few days.  Please Note:  You might notice some irritation and congestion in your nose or some drainage.  This is from the oxygen used during your procedure.  There is no need for concern and it should clear up in a day or so.  SYMPTOMS TO REPORT IMMEDIATELY:  Following upper endoscopy (EGD)  Vomiting of blood or coffee ground material  New chest pain or pain under the shoulder blades  Painful or persistently difficult swallowing  New shortness of breath  Fever of 100F or higher  Black, tarry-looking stools  For urgent or emergent issues, a gastroenterologist can be reached at any hour by calling [phone]. Do not use MyChart messaging for urgent concerns.    DIET:  We do recommend a small meal at first, but then you may proceed to your  regular diet.  Drink plenty of fluids but you should avoid alcoholic beverages for 24 hours.  ACTIVITY:  You should plan to take it easy for the rest of today and you should NOT DRIVE or use heavy machinery until tomorrow (because of the sedation medicines used during the test).    FOLLOW UP: Our staff will call the number listed on your records the next business day following your procedure.  We will call around 7:15- 8:00 am to check on you and address any questions or concerns that you may have regarding the information given to you following your procedure. If we do not reach you, we will leave a message.     If any biopsies were taken you will be contacted by phone or by letter within the next 1-3 weeks.  Please call us at [phone] if you have not heard about the biopsies in 3 weeks.    SIGNATURES/CONFIDENTIALITY: You and/or your care partner have signed paperwork which will be entered into your electronic medical record.  These signatures attest to the fact that that the information above on your After Visit Summary has been reviewed and is understood.  Full responsibility of the confidentiality of this discharge information lies with you and/or your care-partner.

## 2022-11-23 NOTE — Op Note (Signed)
[facility] Patient Name: Kurt Martinez Procedure Date: 11/23/2022 1:39 PM MRN: [id number] Endoscopist: Lorin Picket E. Tomasa Rand , MD, [id number] Age: 67 Referring MD:  Date of Birth: April 25, 1956 Gender: Male Account #: [id number] Procedure:                Upper GI endoscopy Indications:              Follow-up of Barrett's esophagus Medicines:                Monitored Anesthesia Care Procedure:                Pre-Anesthesia Assessment:                           - Prior to the procedure, a History and Physical                            was performed, and patient medications and                            allergies were reviewed. The patient's tolerance of                            previous anesthesia was also reviewed. The risks                            and benefits of the procedure and the sedation                            options and risks were discussed with the patient.                            All questions were answered, and informed consent                            was obtained. Prior Anticoagulants: The patient has                            taken no anticoagulant or antiplatelet agents. ASA                            Grade Assessment: II - A patient with mild systemic                            disease. After reviewing the risks and benefits,                            the patient was deemed in satisfactory condition to                            undergo the procedure.                           After obtaining informed consent, the endoscope was  passed under direct vision. Throughout the                            procedure, the patient's blood pressure, pulse, and                            oxygen saturations were monitored continuously. The                            Olympus Scope [id number] was introduced through the                            mouth, and advanced to the second part of duodenum.                            The upper GI  endoscopy was accomplished without                            difficulty. The patient tolerated the procedure                            well. Scope In: Scope Out: Findings:                 The examined portions of the nasopharynx,                            oropharynx and larynx were normal.                           There were esophageal mucosal changes classified as                            Barrett's stage C6-M7 per Prague criteria present                            in the middle third of the esophagus and in the                            lower third of the esophagus. The maximum                            longitudinal extent of these mucosal changes was 7                            cm in length. Squamous islands were seen within the                            Barrett's mucosa, but no other mucosal                            abnormalities or polypoid lesions were seen. Mucosa  was biopsied with a cold forceps for histology in 4                            quadrants at intervals of 2 cm at 31, 32, 34, 36                            and 38 cm from the incisors. A total of 5 specimen                            bottles were sent to pathology. Estimated blood                            loss was minimal.                           The exam of the esophagus was otherwise normal.                           A 3 cm hiatal hernia was present.                           Multiple 2 to 15 mm pedunculated and sessile polyps                            with no bleeding and no stigmata of recent bleeding                            were found in the gastric fundus and in the gastric                            body. Five of them were estimated to be >1 cm.                            Approximately 20 of these polyps were removed with                            a cold snare. Resection was complete, but not all                            of the polyps were retrieved with the Roth net.                             None of the resected polyps were able to be                            suctioned through the suction port. Estimated blood                            loss was minimal.                           The exam of the  stomach was otherwise normal.                           The examined duodenum was normal. Complications:            No immediate complications. Estimated Blood Loss:     Estimated blood loss was minimal. Impression:               - The examined portions of the nasopharynx,                            oropharynx and larynx were normal.                           - Esophageal mucosal changes classified as                            Barrett's stage C6-M7 per Prague criteria. Biopsied.                           - 3 cm hiatal hernia.                           - Multiple gastric polyps. Complete resection.                            Partial retrieval. These appeared consistent with                            fundic gland polyps.                           - Normal examined duodenum. Recommendation:           - Patient has a contact number available for                            emergencies. The signs and symptoms of potential                            delayed complications were discussed with the                            patient. Return to normal activities tomorrow.                            Written discharge instructions were provided to the                            patient.                           - Resume previous diet.                           - Continue present medications, including daily  omeprazole.                           - Await pathology results.                           - Repeat upper endoscopy in 3 years for                            surveillance of Barrett's esophagus, sooner if any                            dysplasia is seen on biopsies. Avani Sensabaugh E. Tomasa Rand, MD 11/23/2022 2:49:29 PM This report has been  signed electronically.

## 2022-11-26 ENCOUNTER — Telehealth: Payer: Self-pay | Admitting: *Deleted

## 2022-11-26 NOTE — Telephone Encounter (Signed)
  Follow up Call-     11/23/2022   12:58 PM  Call back number  Post procedure Call Back phone  # [phone]  Permission to leave phone message Yes     Patient questions:   Message left to call us if necessary.

## 2022-12-03 ENCOUNTER — Other Ambulatory Visit: Payer: Self-pay | Admitting: Nurse Practitioner

## 2022-12-03 ENCOUNTER — Encounter: Payer: Self-pay | Admitting: Nurse Practitioner

## 2022-12-03 MED ORDER — SILDENAFIL CITRATE 50 MG PO TABS: 50.0000 mg | ORAL_TABLET | Freq: Every day | ORAL | 3 refills | Status: DC | PRN

## 2022-12-05 ENCOUNTER — Encounter: Payer: Self-pay | Admitting: Gastroenterology

## 2022-12-05 NOTE — Progress Notes (Signed)
Kurt Martinez,  The biopsies that I took during your recent procedure showed Barrett's mucosa (intestinal metaplasia), but NO sign of the pre-cancerous change called "dysplasia."   Therefore, unless new symptoms arise you will not need another upper endoscopy for 3 years.  You should continue to take a proton pump inhibitor daily.  We will put you in our reminder system and contact you at that time.  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  There was no evidence of infection with Helicobacter pylori or intestinal metaplasia/dysplasia.  These types of polyps are typically secondary to acid suppression therapy and no specific follow-up is required.

## 2022-12-10 DIAGNOSIS — F329 Major depressive disorder, single episode, unspecified: Secondary | ICD-10-CM | POA: Diagnosis not present

## 2023-01-02 ENCOUNTER — Other Ambulatory Visit: Payer: Self-pay | Admitting: Nurse Practitioner

## 2023-01-02 ENCOUNTER — Encounter: Payer: Self-pay | Admitting: Nurse Practitioner

## 2023-01-02 DIAGNOSIS — M5126 Other intervertebral disc displacement, lumbar region: Secondary | ICD-10-CM

## 2023-01-02 MED ORDER — TRAMADOL HCL 50 MG PO TABS
50.0000 mg | ORAL_TABLET | Freq: Three times a day (TID) | ORAL | 2 refills | Status: DC | PRN
Start: 2023-01-02 — End: 2023-04-08

## 2023-01-07 DIAGNOSIS — F329 Major depressive disorder, single episode, unspecified: Secondary | ICD-10-CM | POA: Diagnosis not present

## 2023-01-12 ENCOUNTER — Emergency Department (HOSPITAL_BASED_OUTPATIENT_CLINIC_OR_DEPARTMENT_OTHER): Payer: Medicare Other

## 2023-01-12 ENCOUNTER — Other Ambulatory Visit: Payer: Self-pay

## 2023-01-12 ENCOUNTER — Encounter (HOSPITAL_BASED_OUTPATIENT_CLINIC_OR_DEPARTMENT_OTHER): Payer: Self-pay | Admitting: *Deleted

## 2023-01-12 DIAGNOSIS — M79675 Pain in left toe(s): Secondary | ICD-10-CM | POA: Diagnosis not present

## 2023-01-12 DIAGNOSIS — S99922A Unspecified injury of left foot, initial encounter: Secondary | ICD-10-CM | POA: Insufficient documentation

## 2023-01-12 DIAGNOSIS — Z5321 Procedure and treatment not carried out due to patient leaving prior to being seen by health care provider: Secondary | ICD-10-CM | POA: Insufficient documentation

## 2023-01-12 DIAGNOSIS — W228XXA Striking against or struck by other objects, initial encounter: Secondary | ICD-10-CM | POA: Diagnosis not present

## 2023-01-12 NOTE — ED Triage Notes (Signed)
Pt is here for injury to left great toe.  Pt states that a shelf fell on his toe today around noon.  Pt has discoloration under toe nail and describes a throbbing pain.

## 2023-01-13 ENCOUNTER — Emergency Department (HOSPITAL_BASED_OUTPATIENT_CLINIC_OR_DEPARTMENT_OTHER)
Admission: EM | Admit: 2023-01-13 | Discharge: 2023-01-13 | Discharge status: Against Medical Advice | Payer: Medicare Other | Attending: Emergency Medicine | Admitting: Emergency Medicine

## 2023-01-13 NOTE — ED Notes (Signed)
Pt called for room, he had informed registration that he was leaving

## 2023-01-15 DIAGNOSIS — K080 Exfoliation of teeth due to systemic causes: Secondary | ICD-10-CM | POA: Diagnosis not present

## 2023-01-21 ENCOUNTER — Encounter: Payer: Self-pay | Admitting: Gastroenterology

## 2023-01-28 DIAGNOSIS — F329 Major depressive disorder, single episode, unspecified: Secondary | ICD-10-CM | POA: Diagnosis not present

## 2023-01-28 DIAGNOSIS — L814 Other melanin hyperpigmentation: Secondary | ICD-10-CM | POA: Diagnosis not present

## 2023-01-28 DIAGNOSIS — D225 Melanocytic nevi of trunk: Secondary | ICD-10-CM | POA: Diagnosis not present

## 2023-01-28 DIAGNOSIS — L821 Other seborrheic keratosis: Secondary | ICD-10-CM | POA: Diagnosis not present

## 2023-01-28 DIAGNOSIS — L570 Actinic keratosis: Secondary | ICD-10-CM | POA: Diagnosis not present

## 2023-02-13 DIAGNOSIS — K080 Exfoliation of teeth due to systemic causes: Secondary | ICD-10-CM | POA: Diagnosis not present

## 2023-04-06 ENCOUNTER — Other Ambulatory Visit: Payer: Self-pay | Admitting: Family Medicine

## 2023-04-06 DIAGNOSIS — K219 Gastro-esophageal reflux disease without esophagitis: Secondary | ICD-10-CM

## 2023-04-08 ENCOUNTER — Encounter: Payer: Self-pay | Admitting: Nurse Practitioner

## 2023-04-08 ENCOUNTER — Other Ambulatory Visit: Payer: Self-pay | Admitting: Nurse Practitioner

## 2023-04-08 DIAGNOSIS — M5126 Other intervertebral disc displacement, lumbar region: Secondary | ICD-10-CM

## 2023-04-08 MED ORDER — TRAMADOL HCL 50 MG PO TABS
50.0000 mg | ORAL_TABLET | Freq: Three times a day (TID) | ORAL | 2 refills | Status: DC | PRN
Start: 2023-04-08 — End: 2023-07-08

## 2023-04-15 ENCOUNTER — Encounter: Payer: Self-pay | Admitting: Nurse Practitioner

## 2023-04-15 ENCOUNTER — Other Ambulatory Visit: Payer: Self-pay | Admitting: Nurse Practitioner

## 2023-04-15 DIAGNOSIS — F339 Major depressive disorder, recurrent, unspecified: Secondary | ICD-10-CM

## 2023-04-15 MED ORDER — VENLAFAXINE HCL 75 MG PO TABS: 75.0000 mg | ORAL_TABLET | Freq: Every day | ORAL | 3 refills | Status: DC

## 2023-07-08 ENCOUNTER — Encounter: Payer: Self-pay | Admitting: Nurse Practitioner

## 2023-07-08 ENCOUNTER — Other Ambulatory Visit: Payer: Self-pay | Admitting: Nurse Practitioner

## 2023-07-08 DIAGNOSIS — M5126 Other intervertebral disc displacement, lumbar region: Secondary | ICD-10-CM

## 2023-07-08 MED ORDER — TRAMADOL HCL 50 MG PO TABS
50.0000 mg | ORAL_TABLET | Freq: Three times a day (TID) | ORAL | 2 refills | Status: DC | PRN
Start: 2023-07-08 — End: 2023-10-10

## 2023-08-02 DIAGNOSIS — K080 Exfoliation of teeth due to systemic causes: Secondary | ICD-10-CM | POA: Diagnosis not present

## 2023-08-12 ENCOUNTER — Encounter: Payer: Self-pay | Admitting: Nurse Practitioner

## 2023-10-09 ENCOUNTER — Encounter: Payer: Self-pay | Admitting: Nurse Practitioner

## 2023-10-10 ENCOUNTER — Other Ambulatory Visit: Payer: Self-pay | Admitting: Nurse Practitioner

## 2023-10-10 DIAGNOSIS — M5126 Other intervertebral disc displacement, lumbar region: Secondary | ICD-10-CM

## 2023-10-10 MED ORDER — TRAMADOL HCL 50 MG PO TABS
50.0000 mg | ORAL_TABLET | Freq: Three times a day (TID) | ORAL | 2 refills | Status: DC | PRN
Start: 2023-10-10 — End: 2024-01-09

## 2023-10-22 ENCOUNTER — Other Ambulatory Visit: Payer: Self-pay | Admitting: Nurse Practitioner

## 2023-10-22 ENCOUNTER — Encounter: Payer: Self-pay | Admitting: Nurse Practitioner

## 2023-10-22 DIAGNOSIS — K219 Gastro-esophageal reflux disease without esophagitis: Secondary | ICD-10-CM

## 2023-10-22 MED ORDER — OMEPRAZOLE 20 MG PO CPDR
20.0000 mg | DELAYED_RELEASE_CAPSULE | Freq: Two times a day (BID) | ORAL | 1 refills | Status: AC
Start: 2023-10-22 — End: ?

## 2023-11-19 ENCOUNTER — Ambulatory Visit: Payer: Medicare Other | Attending: Nurse Practitioner

## 2023-11-19 ENCOUNTER — Encounter: Payer: Self-pay | Admitting: Nurse Practitioner

## 2023-11-19 ENCOUNTER — Ambulatory Visit (HOSPITAL_BASED_OUTPATIENT_CLINIC_OR_DEPARTMENT_OTHER): Admitting: Nurse Practitioner

## 2023-11-19 VITALS — BP 133/89 | HR 78 | Resp 20 | Ht 69.0 in | Wt 195.4 lb

## 2023-11-19 VITALS — BP 133/89 | HR 78 | Wt 195.3 lb

## 2023-11-19 DIAGNOSIS — E781 Pure hyperglyceridemia: Secondary | ICD-10-CM

## 2023-11-19 DIAGNOSIS — R7989 Other specified abnormal findings of blood chemistry: Secondary | ICD-10-CM | POA: Diagnosis not present

## 2023-11-19 DIAGNOSIS — G2581 Restless legs syndrome: Secondary | ICD-10-CM

## 2023-11-19 DIAGNOSIS — Z0000 Encounter for general adult medical examination without abnormal findings: Secondary | ICD-10-CM

## 2023-11-19 DIAGNOSIS — L819 Disorder of pigmentation, unspecified: Secondary | ICD-10-CM

## 2023-11-19 DIAGNOSIS — N528 Other male erectile dysfunction: Secondary | ICD-10-CM

## 2023-11-19 DIAGNOSIS — R0609 Other forms of dyspnea: Secondary | ICD-10-CM

## 2023-11-19 DIAGNOSIS — Z23 Encounter for immunization: Secondary | ICD-10-CM

## 2023-11-19 MED ORDER — BACLOFEN 10 MG PO TABS
10.0000 mg | ORAL_TABLET | Freq: Every day | ORAL | 0 refills | Status: DC | PRN
Start: 2023-11-19 — End: 2024-04-18

## 2023-11-19 MED ORDER — SILDENAFIL CITRATE 50 MG PO TABS
50.0000 mg | ORAL_TABLET | Freq: Every day | ORAL | 3 refills | Status: AC | PRN
Start: 2023-11-19 — End: ?

## 2023-11-19 NOTE — Patient Instructions (Addendum)
 Mr. Kurt Martinez , Thank you for taking time to come for your Medicare Wellness Visit. I appreciate your ongoing commitment to your health goals. Please review the following plan we discussed and let me know if I can assist you in the future.   Referrals/Orders/Follow-Ups/Clinician Recommendations: Keep maintaining your health by keeping your appointments with Zelda Fleming, NP and any specialists that you may see.  Call us  if you need anything.  Have a great year!!!!  This is a list of the screening recommended for you and due dates:  Health Maintenance  Topic Date Due   COVID-19 Vaccine (3 - 2024-25 season) 03/31/2023   Medicare Annual Wellness Visit  11/16/2023   Flu Shot  02/28/2024   Colon Cancer Screening  10/03/2027   DTaP/Tdap/Td vaccine (2 - Td or Tdap) 10/06/2028   Pneumonia Vaccine  Completed   Hepatitis C Screening  Completed   Zoster (Shingles) Vaccine  Completed   HPV Vaccine  Aged Out   Meningitis B Vaccine  Aged Out    Advanced directives: (Declined) Advance directive discussed with you today. Even though you declined this today, please call our office should you change your mind, and we can give you the proper paperwork for you to fill out.  Next Medicare Annual Wellness Visit scheduled for next year: Yes, It was nice speaking with you today! Your next Annual Wellness Visit is scheduled for 11/24/2024 at 2:50 p.m. via PHONE VISIT. If you need to reschedule or cancel, please call [phone].

## 2023-11-19 NOTE — Progress Notes (Signed)
 Because this visit was a virtual/telehealth visit,  certain criteria was not obtained, such a blood pressure, CBG if applicable, and timed get up and go. Any medications not marked as "taking" were not mentioned during the medication reconciliation part of the visit. Any vitals not documented were not able to be obtained due to this being a telehealth visit or patient was unable to self-report a recent blood pressure reading due to a lack of equipment at home via telehealth. Vitals that have been documented are verbally provided by the patient.   Subjective:   Kurt Martinez is a 68 y.o. who presents for a Medicare Wellness preventive visit.  Visit Complete: In person visit  Persons Participating in Visit: Patient.  AWV Questionnaire: No: Patient Medicare AWV questionnaire was not completed prior to this visit.  Cardiac Risk Factors include: advanced age (>33men, >37 women);male gender     Objective:    Today's Vitals   11/19/23 1514  BP: 133/89  Pulse: 78  SpO2: 100%  Weight: 195 lb 5.2 oz (88.6 kg)  PainSc: 0-No pain   Body mass index is 28.84 kg/m.     11/19/2023    4:43 PM 01/12/2023   10:34 PM 11/16/2022    3:04 PM  Advanced Directives  Does Patient Have a Medical Advance Directive? No No Yes  Type of Best boy of [place];Living will  Copy of Healthcare Power of Attorney in Chart?   No - copy requested  Would patient like information on creating a medical advance directive? No - Patient declined      Current Medications (verified) Outpatient Encounter Medications as of 11/19/2023  Medication Sig   Misc. Devices MISC Please provide patient with insurance approved cpap equipment (mask and hose). Faxed to [phone] [facility] in [place], [place]. ICD 10 G47.33 Z99.89   omeprazole  (PRILOSEC) 20 MG capsule Take 1 capsule (20 mg total) by mouth 2 (two) times daily before a meal.   sildenafil  (VIAGRA ) 50 MG tablet Take 1-2  tablets (50-100 mg total) by mouth daily as needed for erectile dysfunction. (Patient not taking: Reported on 11/19/2023)   traMADol  (ULTRAM ) 50 MG tablet Take 1 tablet (50 mg total) by mouth every 8 (eight) hours as needed.   venlafaxine  (EFFEXOR ) 75 MG tablet Take 1 tablet (75 mg total) by mouth daily.   [DISCONTINUED] baclofen  (LIORESAL ) 10 MG tablet TAKE 1-2 TABLETS (10-20 MG TOTAL) BY MOUTH DAILY AS NEEDED. FOR BREAKTHROUGH PAIN   [DISCONTINUED] sildenafil  (VIAGRA ) 50 MG tablet Take 1-2 tablets (50-100 mg total) by mouth daily as needed for erectile dysfunction.   Facility-Administered Encounter Medications as of 11/19/2023  Medication   0.9 %  sodium chloride  infusion    Allergies (verified) Propofol   History: Past Medical History:  Diagnosis Date   Allergy    Anemia    Anxiety    Arthritis    Barretts esophagus    Cancer (HCC)    Skin cancer   Depression    GERD (gastroesophageal reflux disease)    Restless leg syndrome    Sleep apnea    Past Surgical History:  Procedure Laterality Date   CHOLECYSTECTOMY     2007   MOUTH SURGERY     tooth removed   ROTATOR CUFF REPAIR Right    SKIN CANCER EXCISION     Basel cell 2008, 2003   Family History  Problem Relation Age of Onset   Brain cancer Mother    Cancer Father  Prostate cancer Brother    Colon cancer Neg Hx    Esophageal cancer Neg Hx    Rectal cancer Neg Hx    Stomach cancer Neg Hx    Social History   Socioeconomic History   Marital status: Divorced    Spouse name: Not on file   Number of children: Not on file   Years of education: Not on file   Highest education level: Not on file  Occupational History   Not on file  Tobacco Use   Smoking status: Never   Smokeless tobacco: Never  Vaping Use   Vaping status: Never Used  Substance and Sexual Activity   Alcohol use: Yes    Comment: once a month   Drug use: Never   Sexual activity: Not Currently  Other Topics Concern   Not on file  Social  History Narrative   Not on file   Social Drivers of Health   Financial Resource Strain: Low Risk  (11/19/2023)   Overall Financial Resource Strain (CARDIA)    Difficulty of Paying Living Expenses: Not hard at all  Food Insecurity: No Food Insecurity (11/19/2023)   Hunger Vital Sign    Worried About Running Out of Food in the Last Year: Never true    Ran Out of Food in the Last Year: Never true  Transportation Needs: No Transportation Needs (11/19/2023)   PRAPARE - Administrator, Civil Service (Medical): No    Lack of Transportation (Non-Medical): No  Physical Activity: Sufficiently Active (11/19/2023)   Exercise Vital Sign    Days of Exercise per Week: 7 days    Minutes of Exercise per Session: 90 min  Stress: No Stress Concern Present (11/19/2023)   [employer] of Occupational Health - Occupational Stress Questionnaire    Feeling of Stress : Not at all  Social Connections: Unknown (11/19/2023)   Social Connection and Isolation Panel [NHANES]    Frequency of Communication with Friends and Family: More than three times a week    Frequency of Social Gatherings with Friends and Family: More than three times a week    Attends Religious Services: Patient unable to answer    Active Member of Clubs or Organizations: Yes    Attends Engineer, structural: More than 4 times per year    Marital Status: Patient unable to answer    Tobacco Counseling Counseling given: Not Answered    Clinical Intake:  Pre-visit preparation completed: Yes  Pain : No/denies pain Pain Score: 0-No pain     BMI - recorded: 28.84 Nutritional Status: BMI 25 -29 Overweight Nutritional Risks: None Diabetes: No  No results found for: "HGBA1C"   How often do you need to have someone help you when you read instructions, pamphlets, or other written materials from your doctor or pharmacy?: 1 - Never  Interpreter Needed?: No  Information entered by :: Kurt Mells N. Obert Espindola,  LPN.   Activities of Daily Living     11/19/2023    4:44 PM  In your present state of health, do you have any difficulty performing the following activities:  Hearing? 0  Vision? 0  Difficulty concentrating or making decisions? 0  Walking or climbing stairs? 0  Dressing or bathing? 0  Doing errands, shopping? 0  Preparing Food and eating ? N  Using the Toilet? N  In the past six months, have you accidently leaked urine? N  Do you have problems with loss of bowel control? N  Managing your Medications?  N  Managing your Finances? N  Housekeeping or managing your Housekeeping? N    Patient Care Team: Collins Dean, NP as PCP - General (Nurse Practitioner) Fara Hone, OD as Consulting Physician (Optometry)  Indicate any recent Medical Services you may have received from other than [facility] providers in the past year (date may be approximate).     Assessment:   This is a routine wellness examination for Kurt Martinez.  Hearing/Vision screen Hearing Screening - Comments:: Denies hearing difficulties.  Vision Screening - Comments:: No rx glasses - up to date with routine eye exams with Fara Hone, OD at [facility]    Goals Addressed             This Visit's Progress    Patient Stated       11/19/2023: My goal is to stay consistent with my diet (vegetarian) and lose weight.         Depression Screen     11/19/2023    2:50 PM 11/16/2022    3:05 PM 10/02/2022   11:16 AM 11/27/2021    9:14 AM 11/27/2021    9:13 AM  PHQ 2/9 Scores  PHQ - 2 Score 1 0 1 1 1   PHQ- 9 Score 6  6      Fall Risk     11/19/2023    4:44 PM 11/19/2023    2:49 PM 11/16/2022    3:05 PM 10/02/2022   11:14 AM 11/27/2021    9:13 AM  Fall Risk   Falls in the past year? 0 0 0 0 0  Number falls in past yr: 0 0 0 0 0  Injury with Fall? 0 0 0 0 0  Risk for fall due to : No Fall Risks No Fall Risks Medication side effect No Fall Risks No Fall Risks  Follow up Falls prevention discussed;Falls  evaluation completed Falls evaluation completed Falls prevention discussed;Education provided;Falls evaluation completed Falls evaluation completed     MEDICARE RISK AT HOME:  Medicare Risk at Home Any stairs in or around the home?: Yes If so, are there any without handrails?: No Home free of loose throw rugs in walkways, pet beds, electrical cords, etc?: Yes Adequate lighting in your home to reduce risk of falls?: Yes Life alert?: No Use of a cane, walker or w/c?: No Grab bars in the bathroom?: Yes Shower chair or bench in shower?: No Elevated toilet seat or a handicapped toilet?: No  TIMED UP AND GO:  Was the test performed?  Yes  Length of time to ambulate 10 feet: 10 sec Gait steady and fast without use of assistive device  Cognitive Function: Normal: Normal cognitive status assessed by direct observation by this Clinical Health Advisor. No abnormalities found. Patient is able to answer questions in an accurate and timely manner.    11/19/2023    4:44 PM  MMSE - Mini Mental State Exam  Orientation to time 5  Orientation to Place 5  Registration 3  Attention/ Calculation 5  Recall 3  Language- name 2 objects 2  Language- repeat 1  Language- follow 3 step command 3  Language- read & follow direction 1  Write a sentence 1  Copy design 1  Total score 30        11/19/2023    4:45 PM 11/16/2022    3:08 PM  6CIT Screen  What Year? 0 points 0 points  What month? 0 points 0 points  What time? 0 points 0 points  Count  back from 20 0 points 0 points  Months in reverse 0 points 0 points  Repeat phrase 0 points 0 points  Total Score 0 points 0 points    Immunizations Immunization History  Administered Date(s) Administered   Fluad Quad(high Dose 65+) 08/15/2022   Influenza Split 04/30/2012   Moderna Covid-19 Fall Seasonal Vaccine 75yrs & older 04/16/2022   PFIZER(Purple Top)SARS-COV-2 Vaccination 06/18/2020   PNEUMOCOCCAL CONJUGATE-20 11/19/2023   Pneumococcal  Polysaccharide-23 02/19/2022   Tdap 10/07/2018   Zoster Recombinant(Shingrix) 10/09/2018, 01/21/2019    Screening Tests Health Maintenance  Topic Date Due   COVID-19 Vaccine (3 - 2024-25 season) 03/31/2023   INFLUENZA VACCINE  02/28/2024   Medicare Annual Wellness (AWV)  11/18/2024   Colonoscopy  10/03/2027   DTaP/Tdap/Td (2 - Td or Tdap) 10/06/2028   Pneumonia Vaccine 52+ Years old  Completed   Hepatitis C Screening  Completed   Zoster Vaccines- Shingrix  Completed   HPV VACCINES  Aged Out   Meningococcal B Vaccine  Aged Out    Health Maintenance  Health Maintenance Due  Topic Date Due   COVID-19 Vaccine (3 - 2024-25 season) 03/31/2023   Health Maintenance Items Addressed: yes  Additional Screening:  Vision Screening: Recommended annual ophthalmology exams for early detection of glaucoma and other disorders of the eye.  Dental Screening: Recommended annual dental exams for proper oral hygiene  Community Resource Referral / Chronic Care Management: CRR required this visit?  No   CCM required this visit?  No     Plan:     I have personally reviewed and noted the following in the patient's chart:   Medical and social history Use of alcohol, tobacco or illicit drugs  Current medications and supplements including opioid prescriptions. Patient is not currently taking opioid prescriptions. Functional ability and status Nutritional status Physical activity Advanced directives List of other physicians Hospitalizations, surgeries, and ER visits in previous 12 months Vitals Screenings to include cognitive, depression, and falls Referrals and appointments  In addition, I have reviewed and discussed with patient certain preventive protocols, quality metrics, and best practice recommendations. A written personalized care plan for preventive services as well as general preventive health recommendations were provided to patient.     Margette Sheldon, LPN   1/61/0960    After Visit Summary: (MyChart) Due to this being a telephonic visit, the after visit summary with patients personalized plan was offered to patient via MyChart   Notes: Nothing significant to report at this time.

## 2023-11-19 NOTE — Progress Notes (Signed)
 Assessment & Plan:  [name] was seen today for medical management of chronic issues.  Diagnoses and all orders for this visit:  Encounter for annual physical exam -     CBC with Differential -     Lipid panel -     CMP14+EGFR  Dyspnea on exertion -     ECHOCARDIOGRAM COMPLETE; Future -     EKG 12-Lead  Atypical pigmented skin lesion -     Cancel: Ambulatory referral to Dermatology -     Ambulatory referral to Dermatology  Abnormal CBC -     CBC with Differential  Hypertriglyceridemia -     Lipid panel  Restless leg syndrome -     baclofen  (LIORESAL ) 10 MG tablet; Take 1-2 tablets (10-20 mg total) by mouth daily as needed. FOR BREAKTHROUGH PAIN (Patient not taking: Reported on 11/19/2023) -     Cancel: Drug Screen 10 W/Conf, Serum  Other male erectile dysfunction -     sildenafil  (VIAGRA ) 50 MG tablet; Take 1-2 tablets (50-100 mg total) by mouth daily as needed for erectile dysfunction. (Patient not taking: Reported on 11/19/2023)  Need for pneumococcal 20-valent conjugate vaccination -     Pneumococcal conjugate vaccine 20-valent    Patient has been counseled on age-appropriate routine health concerns for screening and prevention. These are reviewed and up-to-date. Referrals have been placed accordingly. Immunizations are up-to-date or declined.    Subjective:   Chief Complaint  Patient presents with   Medical Management of Chronic Issues    Kurt Martinez 68 y.o. male presents to office today for annual physical exam.    Dyspnea: Patient complains of shortness of breath after one flight stairs, after more than one flight stairs, and with cycling .  Symptoms began a few weeks ago, unchanged since that time.  Patient denies dyspnea when laying down, productive cough, tightness in chest, wheezing, and chest pain . Associated symptoms include  NONE . Patient has not had recent travel.  Weight has been stable.  Appetite has been unaffected. Symptoms are exacerbated by  bicycling, climbing stairs, and yardwork. Symptoms are alleviated by rest.   Tremor: He complains of tremor. Tremor primarily involves the  bilateral hands .  Onset of symptoms was  several months ago . Symptoms are currently of mild severity. Tremor exacerbated by activity (intention). Tremor is alleviated by resting extremity. Symptoms occur intermittently and last until activity stops. His brother has a history of essential tremors and is currently taking medication for this.   He has numerous areas of actinic keratosis. Also has significant history of skin cancer. Would like to see a different dermatologist.   Review of Systems  Constitutional:  Negative for fever, malaise/fatigue and weight loss.  HENT: Negative.  Negative for nosebleeds.   Eyes: Negative.  Negative for blurred vision, double vision and photophobia.  Respiratory: Negative.  Negative for cough and shortness of breath.   Cardiovascular: Negative.  Negative for chest pain, palpitations and leg swelling.  Gastrointestinal: Negative.  Negative for heartburn, nausea and vomiting.  Genitourinary:        ED  Musculoskeletal:  Negative for myalgias.       RLS  Skin: Negative.        SEE HPI  Neurological:  Positive for tremors. Negative for dizziness, focal weakness, seizures and headaches.  Endo/Heme/Allergies: Negative.   Psychiatric/Behavioral: Negative.  Negative for suicidal ideas.     Past Medical History:  Diagnosis Date   Allergy    Anemia  Anxiety    Arthritis    Barretts esophagus    Cancer (HCC)    Skin cancer   Depression    GERD (gastroesophageal reflux disease)    Restless leg syndrome    Sleep apnea     Past Surgical History:  Procedure Laterality Date   CHOLECYSTECTOMY     2007   MOUTH SURGERY     tooth removed   ROTATOR CUFF REPAIR Right    SKIN CANCER EXCISION     Basel cell 2008, 2003    Family History  Problem Relation Age of Onset   Brain cancer Mother    Cancer Father     Prostate cancer Brother    Colon cancer Neg Hx    Esophageal cancer Neg Hx    Rectal cancer Neg Hx    Stomach cancer Neg Hx     Social History Reviewed with no changes to be made today.   Outpatient Medications Prior to Visit  Medication Sig Dispense Refill   Misc. Devices MISC Please provide patient with insurance approved cpap equipment (mask and hose). Faxed to [phone] [facility] in [place], [place]. ICD 10 G47.33 Z99.89 1 each 0   omeprazole  (PRILOSEC) 20 MG capsule Take 1 capsule (20 mg total) by mouth 2 (two) times daily before a meal. 180 capsule 1   traMADol  (ULTRAM ) 50 MG tablet Take 1 tablet (50 mg total) by mouth every 8 (eight) hours as needed. 90 tablet 2   venlafaxine  (EFFEXOR ) 75 MG tablet Take 1 tablet (75 mg total) by mouth daily. 90 tablet 3   baclofen  (LIORESAL ) 10 MG tablet TAKE 1-2 TABLETS (10-20 MG TOTAL) BY MOUTH DAILY AS NEEDED. FOR BREAKTHROUGH PAIN 180 tablet 0   sildenafil  (VIAGRA ) 50 MG tablet Take 1-2 tablets (50-100 mg total) by mouth daily as needed for erectile dysfunction. 10 tablet 3   Facility-Administered Medications Prior to Visit  Medication Dose Route Frequency Provider Last Rate Last Admin   0.9 %  sodium chloride  infusion  500 mL Intravenous Continuous Elois Hair, MD        Allergies  Allergen Reactions   Propofol Other (See Comments), Rash and Shortness Of Breath    Patient states he had an endoscopy procedure recently, was told by MD they had to oxygenate him for a while, had difficulty waking him and he developed rash\whelps        Objective:    BP 133/89 (BP Location: Left Arm, Patient Position: Sitting, Cuff Size: Normal)   Pulse 78   Resp 20   Ht 5\' 9"  (1.753 m)   Wt 195 lb 6.4 oz (88.6 kg)   SpO2 100%   BMI 28.86 kg/m  Wt Readings from Last 3 Encounters:  11/19/23 195 lb 5.2 oz (88.6 kg)  11/19/23 195 lb 6.4 oz (88.6 kg)  11/23/22 200 lb (90.7 kg)    Physical Exam Constitutional:       Appearance: He is well-developed.  HENT:     Head: Normocephalic and atraumatic.     Right Ear: Hearing, tympanic membrane, ear canal and external ear normal.     Left Ear: Hearing, tympanic membrane, ear canal and external ear normal.     Nose: Nose normal. No mucosal edema or rhinorrhea.     Right Turbinates: Not enlarged.     Left Turbinates: Not enlarged.     Mouth/Throat:     Lips: Pink.     Mouth: Mucous membranes are moist.  Dentition: No gingival swelling, dental abscesses or gum lesions.     Pharynx: Uvula midline.     Tonsils: No tonsillar exudate. 1+ on the right. 1+ on the left.  Eyes:     General: Lids are normal. No scleral icterus.    Extraocular Movements: Extraocular movements intact.     Conjunctiva/sclera: Conjunctivae normal.     Pupils: Pupils are equal, round, and reactive to light.  Neck:     Thyroid: No thyromegaly.     Trachea: No tracheal deviation.  Cardiovascular:     Rate and Rhythm: Normal rate and regular rhythm.     Heart sounds: Normal heart sounds. No murmur heard.    No friction rub. No gallop.  Pulmonary:     Effort: Pulmonary effort is normal. No respiratory distress.     Breath sounds: Normal breath sounds. No wheezing or rales.  Chest:     Chest wall: No mass or tenderness.  Breasts:    Right: No inverted nipple, mass, nipple discharge, skin change or tenderness.     Left: No inverted nipple, mass, nipple discharge, skin change or tenderness.  Abdominal:     General: Bowel sounds are normal. There is no distension.     Palpations: Abdomen is soft. There is no mass.     Tenderness: There is no abdominal tenderness. There is no guarding or rebound.  Musculoskeletal:        General: No tenderness or deformity. Normal range of motion.     Cervical back: Normal range of motion and neck supple.  Lymphadenopathy:     Cervical: No cervical adenopathy.  Skin:    General: Skin is warm and dry.     Capillary Refill: Capillary refill takes  less than 2 seconds.     Findings: No erythema.  Neurological:     Mental Status: He is alert and oriented to person, place, and time.     Cranial Nerves: No cranial nerve deficit.     Sensory: Sensation is intact.     Motor: No abnormal muscle tone.     Coordination: Coordination is intact. Coordination normal.     Gait: Gait is intact.     Deep Tendon Reflexes: Reflexes normal.     Reflex Scores:      Patellar reflexes are 1+ on the right side and 1+ on the left side. Psychiatric:        Attention and Perception: Attention normal.        Mood and Affect: Mood normal.        Speech: Speech normal.        Behavior: Behavior normal.        Thought Content: Thought content normal.        Judgment: Judgment normal.          Patient has been counseled extensively about nutrition and exercise as well as the importance of adherence with medications and regular follow-up. The patient was given clear instructions to go to ER or return to medical center if symptoms don't improve, worsen or new problems develop. The patient verbalized understanding.   Follow-up: Return if symptoms worsen or fail to improve.   Collins Dean, FNP-BC [facility] [place], [place] [phone]   11/19/2023, 5:46 PM

## 2023-11-20 LAB — CBC WITH DIFFERENTIAL/PLATELET
Basophils Absolute: 0.1 10*3/uL (ref 0.0–0.2)
Basos: 1 %
EOS (ABSOLUTE): 0.2 10*3/uL (ref 0.0–0.4)
Eos: 3 %
Hematocrit: 50.5 % (ref 37.5–51.0)
Hemoglobin: 17.1 g/dL (ref 13.0–17.7)
Immature Grans (Abs): 0 10*3/uL (ref 0.0–0.1)
Immature Granulocytes: 0 %
Lymphocytes Absolute: 1.5 10*3/uL (ref 0.7–3.1)
Lymphs: 26 %
MCH: 29.4 pg (ref 26.6–33.0)
MCHC: 33.9 g/dL (ref 31.5–35.7)
MCV: 87 fL (ref 79–97)
Monocytes Absolute: 0.7 10*3/uL (ref 0.1–0.9)
Monocytes: 12 %
Neutrophils Absolute: 3.2 10*3/uL (ref 1.4–7.0)
Neutrophils: 58 %
Platelets: 239 10*3/uL (ref 150–450)
RBC: 5.82 x10E6/uL — ABNORMAL HIGH (ref 4.14–5.80)
RDW: 11.9 % (ref 11.6–15.4)
WBC: 5.6 10*3/uL (ref 3.4–10.8)

## 2023-11-20 LAB — CMP14+EGFR
ALT: 16 IU/L (ref 0–44)
AST: 15 IU/L (ref 0–40)
Albumin: 4.5 g/dL (ref 3.9–4.9)
Alkaline Phosphatase: 96 IU/L (ref 44–121)
BUN/Creatinine Ratio: 13 (ref 10–24)
BUN: 16 mg/dL (ref 8–27)
Bilirubin Total: 0.4 mg/dL (ref 0.0–1.2)
CO2: 27 mmol/L (ref 20–29)
Calcium: 9.4 mg/dL (ref 8.6–10.2)
Chloride: 100 mmol/L (ref 96–106)
Creatinine, Ser: 1.19 mg/dL (ref 0.76–1.27)
Globulin, Total: 2.8 g/dL (ref 1.5–4.5)
Glucose: 100 mg/dL — ABNORMAL HIGH (ref 70–99)
Potassium: 4.7 mmol/L (ref 3.5–5.2)
Sodium: 140 mmol/L (ref 134–144)
Total Protein: 7.3 g/dL (ref 6.0–8.5)
eGFR: 67 mL/min/{1.73_m2} (ref >59)

## 2023-11-20 LAB — LIPID PANEL
Chol/HDL Ratio: 4.5 ratio (ref 0.0–5.0)
Cholesterol, Total: 198 mg/dL (ref 100–199)
HDL: 44 mg/dL (ref >39)
LDL Chol Calc (NIH): 106 mg/dL — ABNORMAL HIGH (ref 0–99)
Triglycerides: 281 mg/dL — ABNORMAL HIGH (ref 0–149)
VLDL Cholesterol Cal: 48 mg/dL — ABNORMAL HIGH (ref 5–40)

## 2023-11-21 ENCOUNTER — Encounter: Payer: Self-pay | Admitting: Nurse Practitioner

## 2023-11-21 ENCOUNTER — Other Ambulatory Visit (HOSPITAL_COMMUNITY)

## 2023-11-25 ENCOUNTER — Telehealth: Payer: Self-pay

## 2023-11-25 NOTE — Telephone Encounter (Signed)
 Spoke with Diane E at the PA department for [facility] and no PA is needed for the echo.

## 2023-11-28 ENCOUNTER — Encounter: Payer: Self-pay | Admitting: Nurse Practitioner

## 2023-11-28 ENCOUNTER — Ambulatory Visit (HOSPITAL_COMMUNITY): Attending: Cardiology

## 2023-11-28 DIAGNOSIS — R0609 Other forms of dyspnea: Secondary | ICD-10-CM

## 2023-11-28 LAB — ECHOCARDIOGRAM COMPLETE
Area-P 1/2: 2.68 cm2
S' Lateral: 2.6 cm

## 2023-11-29 ENCOUNTER — Other Ambulatory Visit (HOSPITAL_COMMUNITY)

## 2023-11-30 ENCOUNTER — Encounter: Payer: Self-pay | Admitting: Nurse Practitioner

## 2023-12-22 ENCOUNTER — Encounter: Payer: Self-pay | Admitting: Nurse Practitioner

## 2023-12-22 ENCOUNTER — Other Ambulatory Visit: Payer: Self-pay | Admitting: Nurse Practitioner

## 2023-12-22 DIAGNOSIS — G2581 Restless legs syndrome: Secondary | ICD-10-CM

## 2023-12-26 DIAGNOSIS — F32A Depression, unspecified: Secondary | ICD-10-CM | POA: Diagnosis not present

## 2023-12-26 DIAGNOSIS — G4733 Obstructive sleep apnea (adult) (pediatric): Secondary | ICD-10-CM | POA: Diagnosis not present

## 2023-12-26 DIAGNOSIS — G2581 Restless legs syndrome: Secondary | ICD-10-CM | POA: Diagnosis not present

## 2024-01-01 ENCOUNTER — Encounter: Payer: Self-pay | Admitting: Nurse Practitioner

## 2024-01-01 ENCOUNTER — Other Ambulatory Visit: Payer: Self-pay | Admitting: Nurse Practitioner

## 2024-01-01 ENCOUNTER — Ambulatory Visit: Admitting: Neurology

## 2024-01-01 ENCOUNTER — Encounter: Payer: Self-pay | Admitting: Neurology

## 2024-01-01 VITALS — BP 126/84 | HR 64 | Ht 69.0 in | Wt 194.0 lb

## 2024-01-01 DIAGNOSIS — G2581 Restless legs syndrome: Secondary | ICD-10-CM

## 2024-01-01 DIAGNOSIS — Z8639 Personal history of other endocrine, nutritional and metabolic disease: Secondary | ICD-10-CM

## 2024-01-01 DIAGNOSIS — Z8669 Personal history of other diseases of the nervous system and sense organs: Secondary | ICD-10-CM | POA: Diagnosis not present

## 2024-01-01 DIAGNOSIS — Z862 Personal history of diseases of the blood and blood-forming organs and certain disorders involving the immune mechanism: Secondary | ICD-10-CM

## 2024-01-01 DIAGNOSIS — R7989 Other specified abnormal findings of blood chemistry: Secondary | ICD-10-CM

## 2024-01-01 NOTE — Progress Notes (Signed)
 Subjective:    Patient ID: Kurt Martinez is a 68 y.o. male.  HPI    Debbra Fairy, MD, PhD [facility] [address] [address] [place], [place] [address]  Dear Kurt Martinez,   I saw your patient, [name], upon your kind request in my neurologic clinic today for initial consultation of restless leg syndrome.  The patient is unaccompanied today.  As you know, [name] is a 68 year old male with an underlying medical history of allergies, anemia, anxiety, arthritis, skin cancer, depression, sleep apnea, reflux disease, Barrett's esophagus, and overweight state, who reports a longstanding history of restless leg syndrome.  Symptoms started in 2010. He reports that he was severely iron deficient at the time as he was donating blood every 48 days and he had been on a vegetarian diet.  He has been taking the iron supplement but has not had any testing on his ferritin level in a while.  He has been on an antidepressant for a long time, has tried multiple different medications and currently is on Effexor  75 mg daily which has been effective and he is aware that Effexor  can exacerbate restless leg symptoms.  He had sleep study testing in [place]  in 2010 and was diagnosed with sleep apnea and has been on a PAP machine since then.  Prior sleep study results are not available for my review today.  Of note, he recently had a consultation with a sleep specialist at [facility] medicine and was advised to get a new CPAP machine and consider adding gabapentin for his restless leg syndrome.  He still wanted to keep this appointment for another opinion.  [facility] office records are not available for my review today.  I reviewed your office note from 11/19/2023.  He has been on tramadol  and has tried baclofen  briefly, but had SEs.  He is currently on tramadol  50 mg tid.   He had blood work through your office on 11/19/2023 including CBC with differential, lipid panel and CMP.  I  reviewed the test results.  He did not have a TSH at the time or iron studies.    He is in the process of weaning off of caffeine containing coffee and drinks decaf coffee about 2 cups/day as well as decaf tea about 1 serving every other day.  He does not drink any alcohol.  He quit smoking over 20 years ago.  He reports compliance with his old CPAP machine but is in the process of getting a new machine through [facility].  His Past Medical History Is Significant For: Past Medical History:  Diagnosis Date   Allergy    Anemia    Anxiety    Arthritis    Barretts esophagus    Cancer (HCC)    Skin cancer   Depression    GERD (gastroesophageal reflux disease)    Restless leg syndrome    Sleep apnea     His Past Surgical History Is Significant For: Past Surgical History:  Procedure Laterality Date   CHOLECYSTECTOMY     2007   MOUTH SURGERY     tooth removed   ROTATOR CUFF REPAIR Right    SKIN CANCER EXCISION     Basel cell 2008, 2003    His Family History Is Significant For: Family History  Problem Relation Age of Onset   Brain cancer Mother    Cancer Father    Prostate cancer Brother    Colon cancer Neg Hx  Esophageal cancer Neg Hx    Rectal cancer Neg Hx    Stomach cancer Neg Hx    Restless legs syndrome Neg Hx     His Social History Is Significant For: Social History   Socioeconomic History   Marital status: Divorced    Spouse name: Not on file   Number of children: Not on file   Years of education: Not on file   Highest education level: Not on file  Occupational History   Not on file  Tobacco Use   Smoking status: Never   Smokeless tobacco: Never  Vaping Use   Vaping status: Never Used  Substance and Sexual Activity   Alcohol use: Not Currently    Comment: once a month   Drug use: Never   Sexual activity: Not Currently  Other Topics Concern   Not on file  Social History Narrative   Pt lives alone    Retired    Social Drivers of Manufacturing engineer Strain: Low Risk  (11/19/2023)   Overall Financial Resource Strain (CARDIA)    Difficulty of Paying Living Expenses: Not hard at all  Food Insecurity: No Food Insecurity (11/19/2023)   Hunger Vital Sign    Worried About Running Out of Food in the Last Year: Never true    Ran Out of Food in the Last Year: Never true  Transportation Needs: No Transportation Needs (11/19/2023)   PRAPARE - Administrator, Civil Service (Medical): No    Lack of Transportation (Non-Medical): No  Physical Activity: Sufficiently Active (11/19/2023)   Exercise Vital Sign    Days of Exercise per Week: 7 days    Minutes of Exercise per Session: 90 min  Stress: No Stress Concern Present (11/19/2023)   [employer] of Occupational Health - Occupational Stress Questionnaire    Feeling of Stress : Not at all  Social Connections: Unknown (11/19/2023)   Social Connection and Isolation Panel [NHANES]    Frequency of Communication with Friends and Family: More than three times a week    Frequency of Social Gatherings with Friends and Family: More than three times a week    Attends Religious Services: Patient unable to answer    Active Member of Clubs or Organizations: Yes    Attends Engineer, structural: More than 4 times per year    Marital Status: Patient unable to answer    His Allergies Are:  Allergies  Allergen Reactions   Propofol Other (See Comments), Rash and Shortness Of Breath    Patient states he had an endoscopy procedure recently, was told by MD they had to oxygenate him for a while, had difficulty waking him and he developed rash\whelps   :   His Current Medications Are:  Outpatient Encounter Medications as of 01/01/2024  Medication Sig   Misc. Devices MISC Please provide patient with insurance approved cpap equipment (mask and hose). Faxed to [phone] [facility] in [place], [place]. ICD 10 G47.33 Z99.89   omeprazole  (PRILOSEC) 20 MG  capsule Take 1 capsule (20 mg total) by mouth 2 (two) times daily before a meal.   sildenafil  (VIAGRA ) 50 MG tablet Take 1-2 tablets (50-100 mg total) by mouth daily as needed for erectile dysfunction.   traMADol  (ULTRAM ) 50 MG tablet Take 1 tablet (50 mg total) by mouth every 8 (eight) hours as needed.   venlafaxine  (EFFEXOR ) 75 MG tablet Take 1 tablet (75 mg total) by mouth daily.   baclofen  (LIORESAL ) 10 MG  tablet Take 1-2 tablets (10-20 mg total) by mouth daily as needed. FOR BREAKTHROUGH PAIN (Patient not taking: Reported on 11/19/2023)   Facility-Administered Encounter Medications as of 01/01/2024  Medication   0.9 %  sodium chloride  infusion  :   Review of Systems:  Out of a complete 14 point review of systems, all are reviewed and negative with the exception of these symptoms as listed below:  Review of Systems  Neurological:        Pt here for RLS Pt states has had RLS for 15 years Pt states was taking Tramadol  150mg  daily Pt states tramdol stop working Pt states for the past week has been taking tramadol  200mg  2x daily     Objective:  Neurological Exam  Physical Exam Physical Examination:   Vitals:   01/01/24 1429  BP: 126/84  Pulse: 64    General Examination: The patient is a very pleasant 68 y.o. male in no acute distress. He appears well-developed and well-nourished and well groomed.   HEENT: Normocephalic, atraumatic, tracking well-preserved.  Corrective eyeglasses in place.  Hearing grossly intact.  Face is symmetric with normal facial animation, speech is clear without dysarthria, hypophonia or voice tremor.  No carotid bruits.    Chest: Clear to auscultation without wheezing, rhonchi or crackles noted.  Heart: S1+S2+0, regular and normal without murmurs, rubs or gallops noted.   Extremities: There is no obvious swelling in the ankle areas bilaterally.    Skin: Warm and dry without trophic changes noted.   Musculoskeletal: exam reveals no obvious joint  deformities.   Neurologically:  Mental status: The patient is awake, alert and oriented in all 4 spheres. His immediate and remote memory, attention, language skills and fund of knowledge are appropriate. There is no evidence of aphasia, agnosia, apraxia or anomia. Speech is clear with normal prosody and enunciation. Thought process is linear. Mood is normal and affect is normal.  Cranial nerves II - XII are as described above under HEENT exam.  Motor exam: Normal bulk, moving all 4 extremities without limitation.  There is no obvious action or resting tremor.  Fine motor skills and coordination: grossly intact.  Cerebellar testing: No dysmetria or intention tremor. There is no truncal or gait ataxia.  Normal gait.  Assessment and Plan:   In summary, AEDEN MATRANGA is a very pleasant 68 y.o.-year old male with an underlying medical history of allergies, anemia, anxiety, arthritis, skin cancer, depression, sleep apnea (on PAP therapy), reflux disease, Barrett's esophagus, and overweight state, who reports a longstanding history of restless leg syndrome, dating back to 2010.  He had severe anemia at the time and was also diagnosed with obstructive sleep apnea at the time.  He was advised of the connection between OSA and restless leg syndrome.  Optimizing his OSA treatment is certainly going to help.  He is established with a new sleep specialist, Dr. Lavella Poser at [facility] sleep clinic.  He is advised to continue to follow-up with Dr. Vicie Grain and they had discussed adding gabapentin for his restless leg symptoms which can certainly be considered.  He is advised to follow-up with your office to get additional blood work done including iron studies and thyroid function, B12 level as he indicates being a vegetarian.  Last TSH that I could see in his electronic chart was from 2022.  A1c was 5.4 at the time.  Below is a summary of our discussion points based on today's visit and his history and  chart review  and examination.  He was given these instructions verbally during the visit and also in his after visit summary, which he can access electronically:  I recommend you follow-up with your primary care nurse practitioner to get additional blood work done including your thyroid function, vitamin B12 level, and iron studies. Follow-up with your sleep specialist at [facility] medicine for sleep apnea management as well as restless legs management and consideration of gabapentin next.  I agree with Dr. Vicie Grain to not increase your tramadol . Certain medications can exacerbate restless leg symptoms including antidepressant medications as you very well know, unfortunately, there is not many medications that would not exacerbate restless leg symptoms and since Effexor  is currently working well for you, there is no pressing reason to change your antidepressant regimen. We do not need to make a follow-up appointment in this clinic at this time, as you have established with a new sleep specialist already through [facility] sleep medicine. Thank you very much for letting me weigh in.    Sincerely,   Debbra Fairy, MD, PhD

## 2024-01-01 NOTE — Patient Instructions (Signed)
 It was nice to meet you today.  Here is what we discussed: I recommend you follow-up with your primary care nurse practitioner to get additional blood work done including your thyroid function, vitamin B12 level, and iron studies. Follow-up with your sleep specialist at [facility] medicine for sleep apnea management as well as restless legs management and consideration of gabapentin next.  I agree with Dr. Vicie Grain to not increase your tramadol . Certain medications can exacerbate restless leg symptoms including antidepressant medications as you very well know, unfortunately, there is not many medications that would not exacerbate restless leg symptoms and since Effexor  is currently working well for you, there is no pressing reason to change your antidepressant regimen. We do not need to make a follow-up appointment in this clinic at this time, as you have established with a new sleep specialist already through [facility] sleep medicine.

## 2024-01-06 ENCOUNTER — Ambulatory Visit: Attending: Nurse Practitioner

## 2024-01-06 DIAGNOSIS — Z862 Personal history of diseases of the blood and blood-forming organs and certain disorders involving the immune mechanism: Secondary | ICD-10-CM | POA: Diagnosis not present

## 2024-01-06 DIAGNOSIS — R7989 Other specified abnormal findings of blood chemistry: Secondary | ICD-10-CM

## 2024-01-06 DIAGNOSIS — G2581 Restless legs syndrome: Secondary | ICD-10-CM

## 2024-01-07 LAB — CBC WITH DIFFERENTIAL/PLATELET
Basophils Absolute: 0.1 10*3/uL (ref 0.0–0.2)
Basos: 1 %
EOS (ABSOLUTE): 0.2 10*3/uL (ref 0.0–0.4)
Eos: 2 %
Hematocrit: 54 % — ABNORMAL HIGH (ref 37.5–51.0)
Hemoglobin: 18 g/dL — ABNORMAL HIGH (ref 13.0–17.7)
Immature Grans (Abs): 0 10*3/uL (ref 0.0–0.1)
Immature Granulocytes: 0 %
Lymphocytes Absolute: 2.1 10*3/uL (ref 0.7–3.1)
Lymphs: 26 %
MCH: 29.1 pg (ref 26.6–33.0)
MCHC: 33.3 g/dL (ref 31.5–35.7)
MCV: 87 fL (ref 79–97)
Monocytes Absolute: 0.8 10*3/uL (ref 0.1–0.9)
Monocytes: 9 %
Neutrophils Absolute: 5.1 10*3/uL (ref 1.4–7.0)
Neutrophils: 62 %
Platelets: 298 10*3/uL (ref 150–450)
RBC: 6.18 x10E6/uL — ABNORMAL HIGH (ref 4.14–5.80)
RDW: 14 % (ref 11.6–15.4)
WBC: 8.2 10*3/uL (ref 3.4–10.8)

## 2024-01-07 LAB — THYROID PANEL WITH TSH
Free Thyroxine Index: 2.3 (ref 1.2–4.9)
T3 Uptake Ratio: 29 % (ref 24–39)
T4, Total: 7.8 ug/dL (ref 4.5–12.0)
TSH: 2.58 u[IU]/mL (ref 0.450–4.500)

## 2024-01-07 LAB — IRON,TIBC AND FERRITIN PANEL
Ferritin: 59 ng/mL (ref 30–400)
Iron Saturation: 40 % (ref 15–55)
Iron: 139 ug/dL (ref 38–169)
Total Iron Binding Capacity: 349 ug/dL (ref 250–450)
UIBC: 210 ug/dL (ref 111–343)

## 2024-01-07 LAB — HEMOGLOBIN A1C
Est. average glucose Bld gHb Est-mCnc: 117 mg/dL
Hgb A1c MFr Bld: 5.7 % — ABNORMAL HIGH (ref 4.8–5.6)

## 2024-01-07 LAB — VITAMIN B12: Vitamin B-12: 487 pg/mL (ref 232–1245)

## 2024-01-09 ENCOUNTER — Other Ambulatory Visit: Payer: Self-pay | Admitting: Nurse Practitioner

## 2024-01-09 DIAGNOSIS — M5126 Other intervertebral disc displacement, lumbar region: Secondary | ICD-10-CM

## 2024-01-09 MED ORDER — TRAMADOL HCL 50 MG PO TABS
50.0000 mg | ORAL_TABLET | Freq: Three times a day (TID) | ORAL | 2 refills | Status: DC | PRN
Start: 2024-01-09 — End: 2024-04-18

## 2024-01-13 ENCOUNTER — Ambulatory Visit: Payer: Self-pay | Admitting: Nurse Practitioner

## 2024-01-15 DIAGNOSIS — G4721 Circadian rhythm sleep disorder, delayed sleep phase type: Secondary | ICD-10-CM | POA: Diagnosis not present

## 2024-01-15 DIAGNOSIS — G2581 Restless legs syndrome: Secondary | ICD-10-CM | POA: Diagnosis not present

## 2024-01-15 DIAGNOSIS — G4733 Obstructive sleep apnea (adult) (pediatric): Secondary | ICD-10-CM | POA: Diagnosis not present

## 2024-01-29 DIAGNOSIS — G4721 Circadian rhythm sleep disorder, delayed sleep phase type: Secondary | ICD-10-CM | POA: Diagnosis not present

## 2024-01-29 DIAGNOSIS — G2581 Restless legs syndrome: Secondary | ICD-10-CM | POA: Diagnosis not present

## 2024-02-12 DIAGNOSIS — G4733 Obstructive sleep apnea (adult) (pediatric): Secondary | ICD-10-CM | POA: Diagnosis not present

## 2024-02-12 DIAGNOSIS — G2581 Restless legs syndrome: Secondary | ICD-10-CM | POA: Diagnosis not present

## 2024-02-12 DIAGNOSIS — G4721 Circadian rhythm sleep disorder, delayed sleep phase type: Secondary | ICD-10-CM | POA: Diagnosis not present

## 2024-02-13 DIAGNOSIS — G4733 Obstructive sleep apnea (adult) (pediatric): Secondary | ICD-10-CM | POA: Diagnosis not present

## 2024-02-29 ENCOUNTER — Other Ambulatory Visit: Payer: Self-pay | Admitting: Nurse Practitioner

## 2024-02-29 DIAGNOSIS — K219 Gastro-esophageal reflux disease without esophagitis: Secondary | ICD-10-CM

## 2024-03-02 NOTE — Telephone Encounter (Signed)
 Requested Prescriptions  Refused Prescriptions Disp Refills   omeprazole  (PRILOSEC) 20 MG capsule [Pharmacy Med Name: OMEPRAZOLE  20MG  CAPSULES] 180 capsule 1    Sig: TAKE 1 CAPSULE(20 MG) BY MOUTH TWICE DAILY BEFORE A MEAL     Gastroenterology: Proton Pump Inhibitors Passed - 03/02/2024  2:40 PM      Passed - Valid encounter within last 12 months    Recent Outpatient Visits           3 months ago Encounter for annual physical exam   [facility] [place]. [facility] Theotis Haze ORN, NP   1 year ago Encounter for annual physical exam   [facility] [place]. [facility] Theotis Haze ORN, NP   2 years ago Encounter to establish care   [facility] [place]. [facility] Theotis Haze ORN, [place]

## 2024-03-10 DIAGNOSIS — G4733 Obstructive sleep apnea (adult) (pediatric): Secondary | ICD-10-CM | POA: Diagnosis not present

## 2024-04-02 DIAGNOSIS — K080 Exfoliation of teeth due to systemic causes: Secondary | ICD-10-CM | POA: Diagnosis not present

## 2024-04-10 DIAGNOSIS — G4733 Obstructive sleep apnea (adult) (pediatric): Secondary | ICD-10-CM | POA: Diagnosis not present

## 2024-04-17 ENCOUNTER — Encounter: Payer: Self-pay | Admitting: Nurse Practitioner

## 2024-04-17 ENCOUNTER — Other Ambulatory Visit: Payer: Self-pay | Admitting: Nurse Practitioner

## 2024-04-17 DIAGNOSIS — M5126 Other intervertebral disc displacement, lumbar region: Secondary | ICD-10-CM

## 2024-04-18 ENCOUNTER — Other Ambulatory Visit: Payer: Self-pay | Admitting: Nurse Practitioner

## 2024-04-18 DIAGNOSIS — F339 Major depressive disorder, recurrent, unspecified: Secondary | ICD-10-CM

## 2024-04-18 MED ORDER — VENLAFAXINE HCL 75 MG PO TABS
75.0000 mg | ORAL_TABLET | Freq: Every day | ORAL | 3 refills | Status: AC
Start: 2024-04-18 — End: ?

## 2024-05-05 DIAGNOSIS — G4721 Circadian rhythm sleep disorder, delayed sleep phase type: Secondary | ICD-10-CM | POA: Diagnosis not present

## 2024-05-05 DIAGNOSIS — G4733 Obstructive sleep apnea (adult) (pediatric): Secondary | ICD-10-CM | POA: Diagnosis not present

## 2024-05-05 DIAGNOSIS — G2581 Restless legs syndrome: Secondary | ICD-10-CM | POA: Diagnosis not present

## 2024-05-19 ENCOUNTER — Encounter: Payer: Self-pay | Admitting: Nurse Practitioner

## 2024-06-20 ENCOUNTER — Other Ambulatory Visit: Payer: Self-pay | Admitting: Nurse Practitioner

## 2024-06-20 ENCOUNTER — Encounter: Payer: Self-pay | Admitting: Nurse Practitioner

## 2024-06-20 DIAGNOSIS — M5126 Other intervertebral disc displacement, lumbar region: Secondary | ICD-10-CM

## 2024-06-20 NOTE — Telephone Encounter (Signed)
 Requested medication (s) are due for refill today: yes   Requested medication (s) are on the active medication list: yes   Last refill:  04/18/24 #90  Future visit scheduled: yes   Notes to clinic:  Please review for refill. Refill not delegated per protocol    Requested Prescriptions  Pending Prescriptions Disp Refills   traMADol  (ULTRAM ) 50 MG tablet [Pharmacy Med Name: TRAMADOL  50MG  TABLETS] 90 tablet     Sig: TAKE 1 TABLET(50 MG) BY MOUTH EVERY 8 HOURS AS NEEDED     Not Delegated - Analgesics:  Opioid Agonists Failed - 06/20/2024 12:01 PM      Failed - This refill cannot be delegated      Failed - Urine Drug Screen completed in last 360 days      Failed - Valid encounter within last 3 months    Recent Outpatient Visits           7 months ago Encounter for annual physical exam   [facility] [place]. [facility] Theotis Haze ORN, NP   1 year ago Encounter for annual physical exam   [facility] [place]. [facility] Theotis Haze ORN, NP   2 years ago Encounter to establish care   [facility] [place]. [facility] Theotis Haze ORN, [place]

## 2024-07-21 ENCOUNTER — Other Ambulatory Visit: Payer: Self-pay | Admitting: Nurse Practitioner

## 2024-07-21 DIAGNOSIS — M5126 Other intervertebral disc displacement, lumbar region: Secondary | ICD-10-CM

## 2024-07-22 ENCOUNTER — Encounter: Payer: Self-pay | Admitting: Nurse Practitioner

## 2024-08-19 ENCOUNTER — Encounter: Payer: Self-pay | Admitting: Nurse Practitioner

## 2024-08-21 ENCOUNTER — Other Ambulatory Visit: Payer: Self-pay | Admitting: Nurse Practitioner

## 2024-08-21 DIAGNOSIS — M5126 Other intervertebral disc displacement, lumbar region: Secondary | ICD-10-CM

## 2024-11-24 ENCOUNTER — Ambulatory Visit
